# Patient Record
Sex: Female | Born: 1990 | Race: White | Hispanic: No | Marital: Single | Smoking: Current every day smoker
Health system: Southern US, Community
[De-identification: ages and names within clinical notes are randomized; demographics above are authoritative.]

## PROBLEM LIST (undated history)

## (undated) DIAGNOSIS — F1010 Alcohol abuse, uncomplicated: Secondary | ICD-10-CM

## (undated) DIAGNOSIS — F419 Anxiety disorder, unspecified: Secondary | ICD-10-CM

## (undated) DIAGNOSIS — F1190 Opioid use, unspecified, uncomplicated: Secondary | ICD-10-CM

## (undated) DIAGNOSIS — G8929 Other chronic pain: Secondary | ICD-10-CM

## (undated) DIAGNOSIS — D689 Coagulation defect, unspecified: Secondary | ICD-10-CM

## (undated) DIAGNOSIS — F1210 Cannabis abuse, uncomplicated: Secondary | ICD-10-CM

## (undated) DIAGNOSIS — F1410 Cocaine abuse, uncomplicated: Secondary | ICD-10-CM

## (undated) DIAGNOSIS — R519 Headache, unspecified: Secondary | ICD-10-CM

## (undated) DIAGNOSIS — F17200 Nicotine dependence, unspecified, uncomplicated: Secondary | ICD-10-CM

## (undated) DIAGNOSIS — F1910 Other psychoactive substance abuse, uncomplicated: Secondary | ICD-10-CM

## (undated) HISTORY — DX: Anxiety disorder, unspecified: F41.9

## (undated) HISTORY — PX: APPENDECTOMY: SHX54

## (undated) HISTORY — DX: Coagulation defect, unspecified: D68.9

---
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
## 2010-10-09 LAB — CBC
HCT: 31 % — ABNORMAL LOW (ref 36.0–46.0)
HCT: 32.2 % — ABNORMAL LOW (ref 36.0–46.0)
HCT: 33.5 % — ABNORMAL LOW (ref 36.0–46.0)
HCT: 34 % — ABNORMAL LOW (ref 36.0–46.0)
Hemoglobin: 11.3 g/dL — ABNORMAL LOW (ref 12.0–15.0)
Hemoglobin: 11.4 g/dL — ABNORMAL LOW (ref 12.0–15.0)
Hemoglobin: 11.8 g/dL — ABNORMAL LOW (ref 12.0–15.0)
Hemoglobin: 12.3 g/dL (ref 12.0–15.0)
MCHC: 34.4 g/dL (ref 30.0–36.0)
MCV: 90 fL (ref 78.0–100.0)
MCV: 91.5 fL (ref 78.0–100.0)
MCV: 91.6 fL (ref 78.0–100.0)
Platelets: 355 10*3/uL (ref 150–400)
Platelets: 382 10*3/uL (ref 150–400)
Platelets: 401 10*3/uL — ABNORMAL HIGH (ref 150–400)
Platelets: 426 10*3/uL — ABNORMAL HIGH (ref 150–400)
Platelets: 437 10*3/uL — ABNORMAL HIGH (ref 150–400)
RBC: 3.66 MIL/uL — ABNORMAL LOW (ref 3.87–5.11)
RBC: 3.71 MIL/uL — ABNORMAL LOW (ref 3.87–5.11)
RBC: 3.89 MIL/uL (ref 3.87–5.11)
RDW: 13.3 % (ref 11.5–15.5)
RDW: 13.4 % (ref 11.5–15.5)
RDW: 13.7 % (ref 11.5–15.5)
RDW: 14.2 % (ref 11.5–15.5)
RDW: 14.3 % (ref 11.5–15.5)
WBC: 16.1 10*3/uL — ABNORMAL HIGH (ref 4.0–10.5)
WBC: 18.7 10*3/uL — ABNORMAL HIGH (ref 4.0–10.5)
WBC: 22.1 10*3/uL — ABNORMAL HIGH (ref 4.0–10.5)
WBC: 25.3 10*3/uL — ABNORMAL HIGH (ref 4.0–10.5)

## 2010-10-09 LAB — URINALYSIS, ROUTINE W REFLEX MICROSCOPIC
Ketones, ur: 15 mg/dL — AB
Protein, ur: 30 mg/dL — AB
Urobilinogen, UA: 1 mg/dL (ref 0.0–1.0)

## 2010-10-09 LAB — BODY FLUID CULTURE: Culture: NO GROWTH

## 2010-10-09 LAB — DIFFERENTIAL
Basophils Absolute: 0 10*3/uL (ref 0.0–0.1)
Basophils Absolute: 0 10*3/uL (ref 0.0–0.1)
Basophils Absolute: 0 10*3/uL (ref 0.0–0.1)
Basophils Absolute: 0 10*3/uL (ref 0.0–0.1)
Basophils Absolute: 0.1 10*3/uL (ref 0.0–0.1)
Eosinophils Absolute: 0 10*3/uL (ref 0.0–0.7)
Eosinophils Absolute: 0.1 10*3/uL (ref 0.0–0.7)
Eosinophils Absolute: 0.2 10*3/uL (ref 0.0–0.7)
Eosinophils Absolute: 0.2 10*3/uL (ref 0.0–0.7)
Eosinophils Relative: 1 % (ref 0–5)
Eosinophils Relative: 1 % (ref 0–5)
Eosinophils Relative: 1 % (ref 0–5)
Eosinophils Relative: 1 % (ref 0–5)
Lymphocytes Relative: 3 % — ABNORMAL LOW (ref 12–46)
Lymphocytes Relative: 3 % — ABNORMAL LOW (ref 12–46)
Lymphocytes Relative: 4 % — ABNORMAL LOW (ref 12–46)
Lymphocytes Relative: 5 % — ABNORMAL LOW (ref 12–46)
Lymphocytes Relative: 6 % — ABNORMAL LOW (ref 12–46)
Lymphocytes Relative: 8 % — ABNORMAL LOW (ref 12–46)
Lymphocytes Relative: 9 % — ABNORMAL LOW (ref 12–46)
Lymphs Abs: 1.2 10*3/uL (ref 0.7–4.0)
Lymphs Abs: 1.4 10*3/uL (ref 0.7–4.0)
Lymphs Abs: 1.5 10*3/uL (ref 0.7–4.0)
Monocytes Absolute: 0.6 10*3/uL (ref 0.1–1.0)
Monocytes Absolute: 0.6 10*3/uL (ref 0.1–1.0)
Monocytes Absolute: 0.8 10*3/uL (ref 0.1–1.0)
Monocytes Absolute: 1.5 10*3/uL — ABNORMAL HIGH (ref 0.1–1.0)
Monocytes Absolute: 1.8 10*3/uL — ABNORMAL HIGH (ref 0.1–1.0)
Monocytes Relative: 7 % (ref 3–12)
Neutro Abs: 19.3 10*3/uL — ABNORMAL HIGH (ref 1.7–7.7)
Neutro Abs: 22.1 10*3/uL — ABNORMAL HIGH (ref 1.7–7.7)
Neutro Abs: 23.9 10*3/uL — ABNORMAL HIGH (ref 1.7–7.7)
Neutro Abs: 28.1 10*3/uL — ABNORMAL HIGH (ref 1.7–7.7)
Neutrophils Relative %: 82 % — ABNORMAL HIGH (ref 43–77)
Neutrophils Relative %: 92 % — ABNORMAL HIGH (ref 43–77)
Neutrophils Relative %: 95 % — ABNORMAL HIGH (ref 43–77)

## 2010-10-09 LAB — COMPREHENSIVE METABOLIC PANEL
Albumin: 1.9 g/dL — ABNORMAL LOW (ref 3.5–5.2)
BUN: 13 mg/dL (ref 6–23)
BUN: 8 mg/dL (ref 6–23)
CO2: 17 mEq/L — ABNORMAL LOW (ref 19–32)
Calcium: 7.9 mg/dL — ABNORMAL LOW (ref 8.4–10.5)
Creatinine, Ser: 0.61 mg/dL (ref 0.4–1.2)
Creatinine, Ser: 0.68 mg/dL (ref 0.4–1.2)
GFR calc non Af Amer: >60 mL/min (ref >60)
Glucose, Bld: 107 mg/dL — ABNORMAL HIGH (ref 70–99)
Potassium: 3.6 mEq/L (ref 3.5–5.1)
Sodium: 138 mEq/L (ref 135–145)
Total Bilirubin: 0.4 mg/dL (ref 0.3–1.2)
Total Protein: 5.1 g/dL — ABNORMAL LOW (ref 6.0–8.3)
Total Protein: 5.8 g/dL — ABNORMAL LOW (ref 6.0–8.3)

## 2010-10-09 LAB — HEPATITIS C ANTIBODY: HCV Ab: NEGATIVE

## 2010-10-09 LAB — RPR: RPR Ser Ql: NONREACTIVE

## 2010-10-09 LAB — GC/CHLAMYDIA PROBE AMP, GENITAL
Chlamydia, DNA Probe: NEGATIVE
GC Probe Amp, Genital: NEGATIVE

## 2010-10-09 LAB — CULTURE, BLOOD (ROUTINE X 2): Culture: NO GROWTH

## 2010-10-09 LAB — BASIC METABOLIC PANEL
CO2: 20 mEq/L (ref 19–32)
Calcium: 8.2 mg/dL — ABNORMAL LOW (ref 8.4–10.5)
Creatinine, Ser: 0.7 mg/dL (ref 0.4–1.2)
Glucose, Bld: 112 mg/dL — ABNORMAL HIGH (ref 70–99)

## 2010-10-09 LAB — URINE MICROSCOPIC-ADD ON

## 2010-10-09 LAB — ANAEROBIC CULTURE

## 2010-10-09 LAB — GRAM STAIN

## 2010-10-09 LAB — HIV ANTIBODY (ROUTINE TESTING W REFLEX): HIV: NONREACTIVE

## 2010-10-10 LAB — DIFFERENTIAL
Basophils Relative: 0 % (ref 0–1)
Eosinophils Absolute: 0 10*3/uL (ref 0.0–0.7)
Eosinophils Relative: 0 % (ref 0–5)
Neutrophils Relative %: 87 % — ABNORMAL HIGH (ref 43–77)

## 2010-10-10 LAB — URINALYSIS, ROUTINE W REFLEX MICROSCOPIC
Bilirubin Urine: NEGATIVE
Glucose, UA: 100 mg/dL — AB
Glucose, UA: NEGATIVE mg/dL
Nitrite: NEGATIVE
Protein, ur: 100 mg/dL — AB
Protein, ur: 100 mg/dL — AB
Specific Gravity, Urine: 1.025 (ref 1.005–1.030)
Urobilinogen, UA: 4 mg/dL — ABNORMAL HIGH (ref 0.0–1.0)

## 2010-10-10 LAB — URINE MICROSCOPIC-ADD ON

## 2010-10-10 LAB — HEPATIC FUNCTION PANEL
Albumin: 4 g/dL (ref 3.5–5.2)
Indirect Bilirubin: 0.4 mg/dL (ref 0.3–0.9)
Total Protein: 6.7 g/dL (ref 6.0–8.3)

## 2010-10-10 LAB — BASIC METABOLIC PANEL
BUN: 8 mg/dL (ref 6–23)
CO2: 26 mEq/L (ref 19–32)
Chloride: 103 mEq/L (ref 96–112)
Creatinine, Ser: 0.61 mg/dL (ref 0.4–1.2)
Glucose, Bld: 118 mg/dL — ABNORMAL HIGH (ref 70–99)

## 2010-10-10 LAB — CBC
HCT: 39.2 % (ref 36.0–46.0)
MCHC: 34.8 g/dL (ref 30.0–36.0)
MCV: 90.9 fL (ref 78.0–100.0)
Platelets: 183 10*3/uL (ref 150–400)
RDW: 13.2 % (ref 11.5–15.5)

## 2010-10-10 LAB — URINE CULTURE: Colony Count: NO GROWTH

## 2010-10-10 LAB — PREGNANCY, URINE: Preg Test, Ur: NEGATIVE
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
## 2010-11-16 NOTE — Discharge Summary (Signed)
[name], [name] NO.:  [id number]   MEDICAL RECORD NO.:  [id number]          PATIENT TYPE:  INP   LOCATION:  9319                          [facility]ACILITY:  [facility]   PHYSICIAN:  Allie Bossier, MD        DATE OF BIRTH:  [birth year]   DATE OF ADMISSION:  11/04/2008  DATE OF DISCHARGE:  11/11/2008                               DISCHARGE SUMMARY   [name] is an 20 year old single white, gravida 0, who was seen by the  nurse midwife, Carolanne Grumbling, on Nov 04, 2008.  She initially went to  the [facility] Emergency Department for abdominal pain.  She had a CT  scan which showed multiple pelvic abscesses in the right perihepatic  space upwards of 10 cm.  In the left upper quadrant, there was one  measuring 8 cm as well as some other areas of probable abscesses in the  pelvis.  Of note, her history is significant for a ruptured appendicitis  in 2002 for which she spent approximately 6 months at [facility]  when she was a child.  At that time, she had a partial colectomy done.  When she was seen by the midwife for Dr. Silverio Lay, she was admitted  directly to [facility] and started on IV antibiotics, specifically  doxycycline and Primaxin.  Cervical cultures were done.  They were  eventually negative.  The following day on Nov 06, 2008, her white count  was 23.9, this is down from 25.9.  She was still febrile, and she was  evaluated by Dr. Orvan Falconer from the Infectious Disease Department.  The  doxycycline was discontinued since her chlamydia cultures were negative.  Imipenem was continued.  CT scan was ordered for Nov 07, 2008, and it  showed no improvement, in fact some of the abscesses had continued to  grow.  She was continued on her IV antibiotics and her white count  continued to remain in the 20 range and she continued to have a low-  grade fever.  Her pain was plus/minus on the improvement scale.  By Nov 08, 2008, she underwent a CT-guided abscess drainage at  [facility] by  the Radiology Department, some fluid was withdrawn; however, another CT  scan done on Nov 10, 2008, showed only partial resolution of one of the  large abscesses or persistent large fluid collection consistent with  abscesses.  An ultrasound also done with Nov 10, 2008, that showed a  normal left ovary and a nonvisualized right ovary.  The uterus appeared  to be normal.  At this point, she was transferred to the Faculty  Service, and vancomycin was added on Nov 10, 2008, as well.  The patient  was beginning to asked to go to [facility] at this point, and by Nov 11, 2008, she was adamant that she wanted to be transferred to [facility] where  she had previously spent 6 months with a partial colectomy due to a  necrotic appendix.  I spoke with Dr. Shelle Iron who agreed to take  her and transfer, and she was sent there via ambulance.  Please note  that during her stay here, she was hemodynamically stable.  She ran a  low-grade temperature throughout.  Her T-max during her stay was 102.5  on Nov 08, 2008.  Her white count never went below 13 and was generally  in the 20 range.  Hemoglobin was always ranged anywhere from 11.8-9.9,  on discharge on Nov 11, 2008, it was 11.5.      Allie Bossier, MD  Electronically Signed     MCD/MEDQ  D:  12/09/2008  T:  12/10/2008  Job:  [id number]

## 2011-07-04 ENCOUNTER — Emergency Department (HOSPITAL_COMMUNITY)
Admission: EM | Admit: 2011-07-04 | Discharge: 2011-07-05 | Disposition: A | Discharge status: Home / Self Care | Payer: Medicaid Other | Attending: Emergency Medicine | Admitting: Emergency Medicine

## 2011-07-04 ENCOUNTER — Encounter: Payer: Self-pay | Admitting: *Deleted

## 2011-07-04 DIAGNOSIS — M542 Cervicalgia: Secondary | ICD-10-CM | POA: Insufficient documentation

## 2011-07-04 DIAGNOSIS — F17200 Nicotine dependence, unspecified, uncomplicated: Secondary | ICD-10-CM | POA: Insufficient documentation

## 2011-07-04 DIAGNOSIS — M549 Dorsalgia, unspecified: Secondary | ICD-10-CM | POA: Insufficient documentation

## 2011-07-04 DIAGNOSIS — R0602 Shortness of breath: Secondary | ICD-10-CM | POA: Insufficient documentation

## 2011-07-04 MED ORDER — OXYCODONE-ACETAMINOPHEN 5-325 MG PO TABS
1.0000 | ORAL_TABLET | Freq: Once | ORAL | Status: AC
  Administered 2011-07-05: 1 via ORAL
  Filled 2011-07-04: qty 1

## 2011-07-04 MED ORDER — IBUPROFEN 800 MG PO TABS
800.0000 mg | ORAL_TABLET | Freq: Once | ORAL | Status: AC
  Administered 2011-07-05: 800 mg via ORAL
  Filled 2011-07-04: qty 1

## 2011-07-04 NOTE — ED Notes (Signed)
Pain low back and up into neck and head

## 2011-07-04 NOTE — ED Provider Notes (Signed)
History     CSN: [id number]  Arrival date & time 07/04/11  2227   First MD Initiated Contact with Patient 07/04/11 2345      Chief Complaint  Patient presents with  . Back Pain    (Consider location/radiation/quality/duration/timing/severity/associated sxs/prior treatment) Patient is a 21 y.o. female presenting with back pain. The history is provided by the patient.  Back Pain  This is a new problem. Pertinent negatives include no chest pain and no abdominal pain.   patient states she is sitting on the couch playing a game when she developed pain in her midback radiating up toward back. It is constant. It's worse with movement. No trauma. No numbness or weakness. No cough or difficulty breathing. No chest pain. She does smoke. No nausea vomiting or diarrhea. She's not had pain like this before. She denies possibility of pregnancy.  History reviewed. No pertinent past medical history.  Past Surgical History  Procedure Date  . Appendectomy     Family History  Problem Relation Age of Onset  . Diabetes Mother     History  Substance Use Topics  . Smoking status: Current Everyday Smoker  . Smokeless tobacco: Not on file  . Alcohol Use: No    OB History    Grav Para Term Preterm Abortions TAB SAB Ect Mult Living                  Review of Systems  HENT: Negative for congestion.   Respiratory: Negative for cough and shortness of breath.   Cardiovascular: Negative for chest pain.  Gastrointestinal: Negative for abdominal pain.  Genitourinary: Negative for hematuria and flank pain.  Musculoskeletal: Positive for back pain.  Skin: Negative for pallor.  Psychiatric/Behavioral: Negative for confusion.    Allergies  Gentamicin and Penicillins  Home Medications   Current Outpatient Rx  Name Route Sig Dispense Refill  . IBUPROFEN 600 MG PO TABS Oral Take 1 tablet (600 mg total) by mouth every 6 (six) hours as needed for pain. 20 tablet 0  . OXYCODONE-ACETAMINOPHEN 5-325  MG PO TABS Oral Take 1-2 tablets by mouth every 6 (six) hours as needed for pain. 20 tablet 0    BP 112/69  Pulse 103  Temp(Src) 98.6 F (37 C) (Oral)  Resp 18  Ht 5\' 3"  (1.6 m)  Wt 115 lb (52.164 kg)  BMI 20.37 kg/m2  SpO2 98%  LMP 06/20/2011  Physical Exam  Nursing note and vitals reviewed. Constitutional: She is oriented to person, place, and time. She appears well-developed and well-nourished.  HENT:  Head: Normocephalic and atraumatic.  Eyes: EOM are normal. Pupils are equal, round, and reactive to light.  Neck: Normal range of motion. Neck supple.  Cardiovascular: Normal rate, regular rhythm and normal heart sounds.   No murmur heard. Pulmonary/Chest: Effort normal and breath sounds normal. No respiratory distress. She has no wheezes. She has no rales.  Abdominal: Soft. Bowel sounds are normal. She exhibits no distension. There is no tenderness. There is no rebound and no guarding.  Musculoskeletal: Normal range of motion.       Increase in pain with rotation of the thorax. No tenderness over thoracic spine. Some tenderness over cervical spine musculature.  Neurological: She is alert and oriented to person, place, and time. No cranial nerve deficit.  Skin: Skin is warm and dry.  Psychiatric: She has a normal mood and affect. Her speech is normal.    ED Course  Procedures (including critical care time)  Labs  Reviewed - No data to display Dg Chest 2 View  07/05/2011  *RADIOLOGY REPORT*  Clinical Data: Upper back pain and shortness of breath  CHEST - 2 VIEW  Comparison: None.  Findings: Normal heart size and pulmonary vascularity.  No focal airspace consolidation in the lungs.  No blunting of costophrenic angles.  No pneumothorax.  IMPRESSION: No evidence of active pulmonary disease.  Original Report Authenticated By: Marlon Pel, M.D.     1. Mid back pain       MDM  Mid back pain. Worse with movement. No clear trauma. X-ray done due to patient's smoking  history. He was negative. She'll be discharged home with pain meds.        Juliet Rude. Rubin Payor, MD 07/05/11 [phone]

## 2011-07-05 ENCOUNTER — Emergency Department (HOSPITAL_COMMUNITY): Payer: Medicaid Other

## 2011-07-05 MED ORDER — IBUPROFEN 600 MG PO TABS: 600.0000 mg | ORAL_TABLET | Freq: Four times a day (QID) | ORAL | Status: AC | PRN

## 2011-07-05 MED ORDER — OXYCODONE-ACETAMINOPHEN 5-325 MG PO TABS: 1.0000 | ORAL_TABLET | Freq: Four times a day (QID) | ORAL | Status: AC | PRN

## 2011-10-23 ENCOUNTER — Emergency Department (HOSPITAL_COMMUNITY)
Admission: EM | Admit: 2011-10-23 | Discharge: 2011-10-23 | Disposition: A | Discharge status: Home / Self Care | Payer: No Typology Code available for payment source | Attending: Emergency Medicine | Admitting: Emergency Medicine

## 2011-10-23 ENCOUNTER — Emergency Department (HOSPITAL_COMMUNITY): Payer: No Typology Code available for payment source

## 2011-10-23 ENCOUNTER — Encounter (HOSPITAL_COMMUNITY): Payer: Self-pay | Admitting: Emergency Medicine

## 2011-10-23 DIAGNOSIS — M25559 Pain in unspecified hip: Secondary | ICD-10-CM | POA: Insufficient documentation

## 2011-10-23 DIAGNOSIS — M25539 Pain in unspecified wrist: Secondary | ICD-10-CM | POA: Insufficient documentation

## 2011-10-23 DIAGNOSIS — M545 Low back pain, unspecified: Secondary | ICD-10-CM | POA: Insufficient documentation

## 2011-10-23 DIAGNOSIS — M79609 Pain in unspecified limb: Secondary | ICD-10-CM | POA: Insufficient documentation

## 2011-10-23 DIAGNOSIS — S61209A Unspecified open wound of unspecified finger without damage to nail, initial encounter: Secondary | ICD-10-CM | POA: Insufficient documentation

## 2011-10-23 DIAGNOSIS — M542 Cervicalgia: Secondary | ICD-10-CM | POA: Insufficient documentation

## 2011-10-23 DIAGNOSIS — IMO0001 Reserved for inherently not codable concepts without codable children: Secondary | ICD-10-CM | POA: Insufficient documentation

## 2011-10-23 LAB — POCT PREGNANCY, URINE: Preg Test, Ur: NEGATIVE

## 2011-10-23 MED ORDER — MORPHINE SULFATE 4 MG/ML IJ SOLN
4.0000 mg | Freq: Once | INTRAMUSCULAR | Status: AC
  Administered 2011-10-23: 4 mg via INTRAVENOUS
  Filled 2011-10-23: qty 1

## 2011-10-23 MED ORDER — IBUPROFEN 800 MG PO TABS: 800.0000 mg | ORAL_TABLET | Freq: Three times a day (TID) | ORAL | Status: AC | PRN

## 2011-10-23 MED ORDER — OXYCODONE-ACETAMINOPHEN 5-325 MG PO TABS: 1.0000 | ORAL_TABLET | Freq: Four times a day (QID) | ORAL | Status: AC | PRN

## 2011-10-23 MED ORDER — DIAZEPAM 5 MG PO TABS: 5.0000 mg | ORAL_TABLET | Freq: Three times a day (TID) | ORAL | Status: AC | PRN

## 2011-10-23 MED ORDER — IBUPROFEN 200 MG PO TABS
400.0000 mg | ORAL_TABLET | Freq: Once | ORAL | Status: AC
  Administered 2011-10-23: 400 mg via ORAL
  Filled 2011-10-23: qty 2

## 2011-10-23 MED ORDER — DIAZEPAM 5 MG PO TABS
5.0000 mg | ORAL_TABLET | Freq: Once | ORAL | Status: AC
  Administered 2011-10-23: 5 mg via ORAL
  Filled 2011-10-23: qty 1
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
## 2011-10-23 NOTE — ED Notes (Signed)
Wound care... Cleaned hands and put bacitracin and bandaid on right middle finger knuckle.

## 2011-10-23 NOTE — ED Notes (Signed)
Patient transported to CT 
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
## 2011-10-26 NOTE — ED Provider Notes (Signed)
Medical screening examination/treatment/procedure(s) were performed by non-physician practitioner and as supervising physician I was immediately available for consultation/collaboration.  Juliet Rude. Rubin Payor, MD 10/26/11 [phone]

## 2011-10-29 ENCOUNTER — Emergency Department (HOSPITAL_COMMUNITY)
Admission: EM | Admit: 2011-10-29 | Discharge: 2011-10-29 | Disposition: A | Discharge status: Home / Self Care | Payer: No Typology Code available for payment source | Attending: Emergency Medicine | Admitting: Emergency Medicine

## 2011-10-29 ENCOUNTER — Encounter (HOSPITAL_COMMUNITY): Payer: Self-pay

## 2011-10-29 DIAGNOSIS — F0781 Postconcussional syndrome: Secondary | ICD-10-CM | POA: Insufficient documentation

## 2011-10-29 DIAGNOSIS — R51 Headache: Secondary | ICD-10-CM

## 2011-10-29 DIAGNOSIS — F17200 Nicotine dependence, unspecified, uncomplicated: Secondary | ICD-10-CM | POA: Insufficient documentation

## 2011-10-29 MED ORDER — METOCLOPRAMIDE HCL 10 MG PO TABS: 10.0000 mg | ORAL_TABLET | Freq: Four times a day (QID) | ORAL | Status: DC | PRN

## 2011-10-29 MED ORDER — SODIUM CHLORIDE 0.9 % IV BOLUS (SEPSIS)
1000.0000 mL | Freq: Once | INTRAVENOUS | Status: AC
  Administered 2011-10-29: 1000 mL via INTRAVENOUS

## 2011-10-29 MED ORDER — METOCLOPRAMIDE HCL 5 MG/ML IJ SOLN
10.0000 mg | Freq: Once | INTRAMUSCULAR | Status: AC
  Administered 2011-10-29: 10 mg via INTRAVENOUS
  Filled 2011-10-29: qty 2

## 2011-10-29 MED ORDER — KETOROLAC TROMETHAMINE 30 MG/ML IJ SOLN
30.0000 mg | Freq: Once | INTRAMUSCULAR | Status: AC
  Administered 2011-10-29: 30 mg via INTRAVENOUS
  Filled 2011-10-29: qty 1

## 2011-10-29 MED ORDER — DEXAMETHASONE SODIUM PHOSPHATE 4 MG/ML IJ SOLN
10.0000 mg | Freq: Once | INTRAMUSCULAR | Status: AC
  Administered 2011-10-29: 10 mg via INTRAVENOUS
  Filled 2011-10-29: qty 3

## 2011-10-29 MED ORDER — DIPHENHYDRAMINE HCL 50 MG/ML IJ SOLN
25.0000 mg | Freq: Once | INTRAMUSCULAR | Status: AC
  Administered 2011-10-29: 25 mg via INTRAVENOUS
  Filled 2011-10-29: qty 1
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
## 2011-10-29 NOTE — Discharge Instructions (Signed)
Concussion and Brain Injury A blow or jolt to the head can disrupt the normal function of the brain. This type of brain injury is often called a "concussion" or a "closed head injury." Concussions are usually not life-threatening. Even so, the effects of a concussion can be serious.  CAUSES  A concussion is caused by a blunt blow to the head. The blow might be direct or indirect as described below.  Direct blow (running into another player during a soccer game, being hit in a fight, or hitting your head on a hard surface).   Indirect blow (when your head moves rapidly and violently back and forth like in a car crash).  SYMPTOMS  The brain is very complex. Every head injury is different. Some symptoms may appear right away. Other symptoms may not show up for days or weeks after the concussion. The signs of concussion can be hard to notice. Early on, problems may be missed by patients, family members, and caregivers. You may look fine even though you are acting or feeling differently.  These symptoms are usually temporary, but may last for days, weeks, or even longer. Symptoms include:  Mild headaches that will not go away.   Having more trouble than usual with:   Remembering things.   Paying attention or concentrating.   Organizing daily tasks.   Making decisions and solving problems.   Slowness in thinking, acting, speaking, or reading.   Getting lost or easily confused.   Feeling tired all the time or lacking energy (fatigue).   Feeling drowsy.   Sleep disturbances.   Sleeping more than usual.   Sleeping less than usual.   Trouble falling asleep.   Trouble sleeping (insomnia).   Loss of balance or feeling lightheaded or dizzy.   Nausea or vomiting.   Numbness or tingling.   Increased sensitivity to:   Sounds.   Lights.   Distractions.  Other symptoms might include:  Vision problems or eyes that tire easily.   Diminished sense of taste or smell.   Ringing  in the ears.   Mood changes such as feeling sad, anxious, or listless.   Becoming easily irritated or angry for little or no reason.   Lack of motivation.  DIAGNOSIS  Your caregiver can usually diagnose a concussion or mild brain injury based on your description of your injury and your symptoms.  Your evaluation might include:  A brain scan to look for signs of injury to the brain. Even if the test shows no injury, you may still have a concussion.   Blood tests to be sure other problems are not present.  TREATMENT   People with a concussion need to be examined and evaluated. Most people with concussions are treated in an emergency department, urgent care, or clinic. Some people must stay in the hospital overnight for further treatment.   Your caregiver will send you home with important instructions to follow. Be sure to carefully follow them.   Tell your caregiver if you are already taking any medicines (prescription, over-the-counter, or natural remedies), or if you are drinking alcohol or taking illegal drugs. Also, talk with your caregiver if you are taking blood thinners (anticoagulants) or aspirin. These drugs may increase your chances of complications. All of this is important information that may affect treatment.   Only take over-the-counter or prescription medicines for pain, discomfort, or fever as directed by your caregiver.  PROGNOSIS  How fast people recover from brain injury varies from person to person.   Although most people have a good recovery, how quickly they improve depends on many factors. These factors include how severe their concussion was, what part of the brain was injured, their age, and how healthy they were before the concussion.  Because all head injuries are different, so is recovery. Most people with mild injuries recover fully. Recovery can take time. In general, recovery is slower in older persons. Also, persons who have had a concussion in the past or have  other medical problems may find that it takes longer to recover from their current injury. Anxiety and depression may also make it harder to adjust to the symptoms of brain injury. HOME CARE INSTRUCTIONS  Return to your normal activities slowly, not all at once. You must give your body and brain enough time for recovery.  Get plenty of sleep at night, and rest during the day. Rest helps the brain to heal.   Avoid staying up late at night.   Keep the same bedtime hours on weekends and weekdays.   Take daytime naps or rest breaks when you feel tired.   Limit activities that require a lot of thought or concentration (brain or cognitive rest). This includes:   Homework or job-related work.   Watching TV.   Computer work.   Avoid activities that could lead to a second brain injury, such as contact or recreational sports, until your caregiver says it is okay. Even after your brain injury has healed, you should protect yourself from having another concussion.   Ask your caregiver when you can return to your normal activities such as driving, bicycling, or operating heavy equipment. Your ability to react may be slower after a brain injury.   Talk with your caregiver about when you can return to work or school.   Inform your teachers, school nurse, school counselor, coach, Product/process development scientist, or work Freight forwarder about your injury, symptoms, and restrictions. They should be instructed to report:   Increased problems with attention or concentration.   Increased problems remembering or learning new information.   Increased time needed to complete tasks or assignments.   Increased irritability or decreased ability to cope with stress.   Increased symptoms.   Take only those medicines that your caregiver has approved.   Do not drink alcohol until your caregiver says you are well enough to do so. Alcohol and certain other drugs may slow your recovery and can put you at risk of further injury.    If it is harder than usual to remember things, write them down.   If you are easily distracted, try to do one thing at a time. For example, do not try to watch TV while fixing dinner.   Talk with family members or close friends when making important decisions.   Keep all follow-up appointments. Repeated evaluation of your symptoms is recommended for your recovery.  PREVENTION  Protect your head from future injury. It is very important to avoid another head or brain injury before you have recovered. In rare cases, another injury has lead to permanent brain damage, brain swelling, or death. Avoid injuries by using:  Seatbelts when riding in a car.   Alcohol only in moderation.   A helmet when biking, skiing, skateboarding, skating, or doing similar activities.   Safety measures in your home.   Remove clutter and tripping hazards from floors and stairways.   Use grab bars in bathrooms and handrails by stairs.   Place non-slip mats on floors and in bathtubs.  Improve lighting in dim areas.  SEEK MEDICAL CARE IF:  A head injury can cause lingering symptoms. You should seek medical care if you have any of the following symptoms for more than 3 weeks after your injury or are planning to return to sports:  Chronic headaches.   Dizziness or balance problems.   Nausea.   Vision problems.   Increased sensitivity to noise or light.   Depression or mood swings.   Anxiety or irritability.   Memory problems.   Difficulty concentrating or paying attention.   Sleep problems.   Feeling tired all the time.  SEEK IMMEDIATE MEDICAL CARE IF:  You have had a blow or jolt to the head and you (or your family or friends) notice:  Severe or worsening headaches.   Weakness (even if only in one hand or one leg or one part of the face), numbness, or decreased coordination.   Repeated vomiting.   Increased sleepiness or passing out.   One black center of the eye (pupil) is larger  than the other.   Convulsions (seizures).   Slurred speech.   Increasing confusion, restlessness, agitation, or irritability.   Lack of ability to recognize people or places.   Neck pain.   Difficulty being awakened.   Unusual behavior changes.   Loss of consciousness.  Older adults with a brain injury may have a higher risk of serious complications such as a blood clot on the brain. Headaches that get worse or an increase in confusion are signs of this complication. If these signs occur, see a caregiver right away. MAKE SURE YOU:   Understand these instructions.   Will watch your condition.   Will get help right away if you are not doing well or get worse.  FOR MORE INFORMATION  Several groups help people with brain injury and their families. They provide information and put people in touch with local resources. These include support groups, rehabilitation services, and a variety of health care professionals. Among these groups, the Brain Injury Association (BIA, www.biausa.org) has a Secretary/administrator that gathers scientific and educational information and works on a national level to help people with brain injury.  Document Released: 09/07/2003 Document Revised: 06/06/2011 Document Reviewed: 02/03/2008 [facility] Patient Information 2012 [place], [place].  Headache Headaches are caused by many different problems. Most commonly, headache is caused by muscle tension from an injury, fatigue, or emotional upset. Excessive muscle contractions in the scalp and neck result in a headache that often feels like a tight band around the head. Tension headaches often have areas of tenderness over the scalp and the back of the neck. These headaches may last for hours, days, or longer, and some may contribute to migraines in those who have migraine problems. Migraines usually cause a throbbing headache, which is made worse by activity. Sometimes only one side of the head hurts. Nausea, vomiting, eye  pain, and avoidance of food are common with migraines. Visual symptoms such as light sensitivity, blind spots, or flashing lights may also occur. Loud noises may worsen migraine headaches. Many factors may cause migraine headaches:  Emotional stress, lack of sleep, and menstrual periods.   Alcohol and some drugs (such as birth control pills).   Diet factors (fasting, caffeine, food preservatives, chocolate).   Environmental factors (weather changes, bright lights, odors, smoke).  Other causes of headaches include minor injuries to the head. Arthritis in the neck; problems with the jaw, eyes, ears, or nose are also causes of headaches. Allergies, drugs, alcohol,  and exposure to smoke can also cause moderate headaches. Rebound headaches can occur if someone uses pain medications for a long period of time and then stops. Less commonly, blood vessel problems in the neck and brain (including stroke) can cause various types of headache. Treatment of headaches includes medicines for pain and relaxation. Ice packs or heat applied to the back of the head and neck help some people. Massaging the shoulders, neck and scalp are often very useful. Relaxation techniques and stretching can help prevent these headaches. Avoid alcohol and cigarette smoking as these tend to make headaches worse. Please see your caregiver if your headache is not better in 2 days.  SEEK IMMEDIATE MEDICAL CARE IF:   You develop a high fever, chills, or repeated vomiting.   You faint or have difficulty with vision.   You develop unusual numbness or weakness of your arms or legs.   Relief of pain is inadequate with medication, or you develop severe pain.   You develop confusion, or neck stiffness.   You have a worsening of a headache or do not obtain relief.  Document Released: 06/17/2005 Document Revised: 06/06/2011 Document Reviewed: 12/11/2006 [facility] Patient Information 2012 [place], [place].  Metoclopramide tablets What is  this medicine? METOCLOPRAMIDE (met oh kloe PRA mide) is used to treat the symptoms of gastroesophageal reflux disease (GERD) like heartburn. It is also used to treat people with slow emptying of the stomach and intestinal tract. This medicine may be used for other purposes; ask your health care provider or pharmacist if you have questions. What should I tell my health care provider before I take this medicine? They need to know if you have any of these conditions: -breast cancer -depression -diabetes -heart failure -high blood pressure -kidney disease -liver disease -Parkinson's disease or a movement disorder -pheochromocytoma -seizures -stomach obstruction, bleeding, or perforation -an unusual or allergic reaction to metoclopramide, procainamide, sulfites, other medicines, foods, dyes, or preservatives -pregnant or trying to get pregnant -breast-feeding How should I use this medicine? Take this medicine by mouth with a glass of water. Follow the directions on the prescription label. Take this medicine on an empty stomach, about 30 minutes before eating. Take your doses at regular intervals. Do not take your medicine more often than directed. Do not stop taking except on the advice of your doctor or health care professional. A special MedGuide will be given to you by the pharmacist with each prescription and refill. Be sure to read this information carefully each time. Talk to your pediatrician regarding the use of this medicine in children. Special care may be needed. Overdosage: If you think you have taken too much of this medicine contact a poison control center or emergency room at once. NOTE: This medicine is only for you. Do not share this medicine with others. What if I miss a dose? If you miss a dose, take it as soon as you can. If it is almost time for your next dose, take only that dose. Do not take double or extra doses. What may interact with this  medicine? -acetaminophen -cyclosporine -digoxin -medicines for blood pressure -medicines for diabetes, including insulin -medicines for hay fever and other allergies -medicines for depression, especially an Monoamine Oxidase Inhibitor (MAOI) -medicines for Parkinson's disease, like levodopa -medicines for sleep or for pain -tetracycline This list may not describe all possible interactions. Give your health care provider a list of all the medicines, herbs, non-prescription drugs, or dietary supplements you use. Also tell them if you  smoke, drink alcohol, or use illegal drugs. Some items may interact with your medicine. What should I watch for while using this medicine? It may take a few weeks for your stomach condition to start to get better. However, do not take this medicine for longer than 12 weeks. The longer you take this medicine, and the more you take it, the greater your chances are of developing serious side effects. If you are an elderly patient, a female patient, or you have diabetes, you may be at an increased risk for side effects from this medicine. Contact your doctor immediately if you start having movements you cannot control such as lip smacking, rapid movements of the tongue, involuntary or uncontrollable movements of the eyes, head, arms and legs, or muscle twitches and spasms. Patients and their families should watch out for worsening depression or thoughts of suicide. Also watch out for any sudden or severe changes in feelings such as feeling anxious, agitated, panicky, irritable, hostile, aggressive, impulsive, severely restless, overly excited and hyperactive, or not being able to sleep. If this happens, especially at the beginning of treatment or after a change in dose, call your doctor. Do not treat yourself for high fever. Ask your doctor or health care professional for advice. You may get drowsy or dizzy. Do not drive, use machinery, or do anything that needs mental  alertness until you know how this drug affects you. Do not stand or sit up quickly, especially if you are an older patient. This reduces the risk of dizzy or fainting spells. Alcohol can make you more drowsy and dizzy. Avoid alcoholic drinks. What side effects may I notice from receiving this medicine? Side effects that you should report to your doctor or health care professional as soon as possible: -allergic reactions like skin rash, itching or hives, swelling of the face, lips, or tongue -abnormal production of milk in females -breast enlargement in both males and females -change in the way you walk -difficulty moving, speaking or swallowing -drooling, lip smacking, or rapid movements of the tongue -excessive sweating -fever -involuntary or uncontrollable movements of the eyes, head, arms and legs -irregular heartbeat or palpitations -muscle twitches and spasms -unusually weak or tired Side effects that usually do not require medical attention (report to your doctor or health care professional if they continue or are bothersome): -change in sex drive or performance -depressed mood -diarrhea -difficulty sleeping -headache -menstrual changes -restless or nervous This list may not describe all possible side effects. Call your doctor for medical advice about side effects. You may report side effects to FDA at 1-800-FDA-1088. Where should I keep my medicine? Keep out of the reach of children. Store at room temperature between 20 and 25 degrees C (68 and 77 degrees F). Protect from light. Keep container tightly closed. Throw away any unused medicine after the expiration date. NOTE: This sheet is a summary. It may not cover all possible information. If you have questions about this medicine, talk to your doctor, pharmacist, or health care provider.  2012, [employer]. (02/10/2008 4:30:05 PM)
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
## 2011-11-04 ENCOUNTER — Emergency Department (HOSPITAL_COMMUNITY)
Admission: EM | Admit: 2011-11-04 | Discharge: 2011-11-04 | Disposition: A | Discharge status: Home / Self Care | Payer: No Typology Code available for payment source | Attending: Emergency Medicine | Admitting: Emergency Medicine

## 2011-11-04 ENCOUNTER — Encounter (HOSPITAL_COMMUNITY): Payer: Self-pay | Admitting: Emergency Medicine

## 2011-11-04 DIAGNOSIS — M542 Cervicalgia: Secondary | ICD-10-CM | POA: Insufficient documentation

## 2011-11-04 DIAGNOSIS — R42 Dizziness and giddiness: Secondary | ICD-10-CM | POA: Insufficient documentation

## 2011-11-04 DIAGNOSIS — R51 Headache: Secondary | ICD-10-CM | POA: Insufficient documentation

## 2011-11-04 MED ORDER — DIAZEPAM 5 MG PO TABS: 5.0000 mg | ORAL_TABLET | Freq: Three times a day (TID) | ORAL | Status: AC | PRN

## 2011-11-04 NOTE — ED Provider Notes (Signed)
Medical screening examination/treatment/procedure(s) were performed by non-physician practitioner and as supervising physician I was immediately available for consultation/collaboration.   Issabelle Mcraney, MD 11/04/11 1617 
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
## 2011-11-20 ENCOUNTER — Ambulatory Visit: Payer: No Typology Code available for payment source | Attending: Specialist | Admitting: Physical Therapy

## 2011-11-20 DIAGNOSIS — R293 Abnormal posture: Secondary | ICD-10-CM | POA: Insufficient documentation

## 2011-11-20 DIAGNOSIS — R5381 Other malaise: Secondary | ICD-10-CM | POA: Insufficient documentation

## 2011-11-20 DIAGNOSIS — IMO0001 Reserved for inherently not codable concepts without codable children: Secondary | ICD-10-CM | POA: Insufficient documentation

## 2011-11-20 DIAGNOSIS — M546 Pain in thoracic spine: Secondary | ICD-10-CM | POA: Insufficient documentation

## 2011-11-20 DIAGNOSIS — M542 Cervicalgia: Secondary | ICD-10-CM | POA: Insufficient documentation

## 2011-11-22 ENCOUNTER — Ambulatory Visit: Payer: No Typology Code available for payment source | Admitting: Physical Therapy

## 2011-11-26 ENCOUNTER — Ambulatory Visit: Payer: No Typology Code available for payment source | Admitting: Physical Therapy

## 2011-11-28 ENCOUNTER — Ambulatory Visit: Payer: No Typology Code available for payment source | Admitting: Physical Therapy

## 2011-12-02 ENCOUNTER — Ambulatory Visit: Payer: No Typology Code available for payment source | Attending: Specialist | Admitting: Physical Therapy

## 2011-12-02 DIAGNOSIS — R293 Abnormal posture: Secondary | ICD-10-CM | POA: Insufficient documentation

## 2011-12-02 DIAGNOSIS — M546 Pain in thoracic spine: Secondary | ICD-10-CM | POA: Insufficient documentation

## 2011-12-02 DIAGNOSIS — M542 Cervicalgia: Secondary | ICD-10-CM | POA: Insufficient documentation

## 2011-12-02 DIAGNOSIS — R5381 Other malaise: Secondary | ICD-10-CM | POA: Insufficient documentation

## 2011-12-02 DIAGNOSIS — IMO0001 Reserved for inherently not codable concepts without codable children: Secondary | ICD-10-CM | POA: Insufficient documentation

## 2011-12-04 ENCOUNTER — Ambulatory Visit: Payer: No Typology Code available for payment source | Admitting: Physical Therapy

## 2011-12-10 ENCOUNTER — Ambulatory Visit: Payer: No Typology Code available for payment source | Admitting: Physical Therapy

## 2011-12-12 ENCOUNTER — Ambulatory Visit: Payer: No Typology Code available for payment source | Admitting: Physical Therapy

## 2011-12-17 ENCOUNTER — Ambulatory Visit: Payer: No Typology Code available for payment source | Admitting: *Deleted

## 2011-12-19 ENCOUNTER — Ambulatory Visit: Payer: No Typology Code available for payment source | Admitting: Physical Therapy

## 2011-12-24 ENCOUNTER — Ambulatory Visit: Payer: No Typology Code available for payment source | Admitting: *Deleted

## 2011-12-26 ENCOUNTER — Ambulatory Visit: Payer: No Typology Code available for payment source | Admitting: *Deleted

## 2012-06-04 ENCOUNTER — Emergency Department (HOSPITAL_COMMUNITY)
Admission: EM | Admit: 2012-06-04 | Discharge: 2012-06-04 | Disposition: A | Discharge status: Home / Self Care | Payer: Self-pay | Attending: Emergency Medicine | Admitting: Emergency Medicine

## 2012-06-04 ENCOUNTER — Encounter (HOSPITAL_COMMUNITY): Payer: Self-pay | Admitting: Emergency Medicine

## 2012-06-04 DIAGNOSIS — Z8669 Personal history of other diseases of the nervous system and sense organs: Secondary | ICD-10-CM | POA: Insufficient documentation

## 2012-06-04 DIAGNOSIS — J029 Acute pharyngitis, unspecified: Secondary | ICD-10-CM | POA: Insufficient documentation

## 2012-06-04 DIAGNOSIS — F17200 Nicotine dependence, unspecified, uncomplicated: Secondary | ICD-10-CM | POA: Insufficient documentation

## 2012-06-04 DIAGNOSIS — H9209 Otalgia, unspecified ear: Secondary | ICD-10-CM | POA: Insufficient documentation

## 2012-06-04 DIAGNOSIS — Z79899 Other long term (current) drug therapy: Secondary | ICD-10-CM | POA: Insufficient documentation

## 2012-06-04 MED ORDER — ERYTHROMYCIN BASE 500 MG PO TABS: 500.0000 mg | ORAL_TABLET | Freq: Two times a day (BID) | ORAL | Status: DC

## 2012-06-04 MED ORDER — ERYTHROMYCIN BASE 250 MG PO TABS
500.0000 mg | ORAL_TABLET | Freq: Once | ORAL | Status: AC
  Administered 2012-06-04: 500 mg via ORAL
  Filled 2012-06-04: qty 2

## 2012-06-04 NOTE — ED Notes (Signed)
Pt c/o sorethroat x 4 days ago that is getting worse. Denies fever. otc meds with no relief. No sweling noted.

## 2012-06-04 NOTE — ED Notes (Signed)
PA at bedside.

## 2012-06-04 NOTE — ED Provider Notes (Signed)
Medical screening examination/treatment/procedure(s) were performed by non-physician practitioner and as supervising physician I was immediately available for consultation/collaboration.   Lyanne Co, MD 06/04/12 [phone]

## 2012-06-04 NOTE — ED Notes (Signed)
Patient with no complaints at this time. Respirations even and unlabored. Skin warm/dry. Discharge instructions reviewed with patient at this time. Patient given opportunity to voice concerns/ask questions. Patient discharged at this time and left Emergency Department with steady gait.   

## 2012-06-04 NOTE — ED Provider Notes (Signed)
History     CSN: [id number]  Arrival date & time 06/04/12  4098   First MD Initiated Contact with Patient 06/04/12 [phone]      Chief Complaint  Patient presents with  . Sore Throat    (Consider location/radiation/quality/duration/timing/severity/associated sxs/prior treatment) HPI Comments: Denies fever.  + L ear pain   Patient is a 21 y.o. female presenting with pharyngitis. The history is provided by the patient. No language interpreter was used.  Sore Throat This is a new problem. Episode onset: 4 days ago. The problem occurs constantly. The problem has been gradually worsening. Associated symptoms include a sore throat. Pertinent negatives include no chills or fever. The symptoms are aggravated by swallowing. Treatments tried: dayquil. The treatment provided mild relief.    Past Medical History  Diagnosis Date  . Concussion     Past Surgical History  Procedure Date  . Appendectomy     Family History  Problem Relation Age of Onset  . Diabetes Mother   . Hypertension Mother     History  Substance Use Topics  . Smoking status: Current Every Day Smoker    Types: Cigarettes  . Smokeless tobacco: Not on file  . Alcohol Use: Yes     Comment: occasional drinker    OB History    Grav Para Term Preterm Abortions TAB SAB Ect Mult Living                  Review of Systems  Constitutional: Negative for fever and chills.  HENT: Positive for ear pain and sore throat. Negative for hearing loss and ear discharge.     Allergies  Gentamicin and Penicillins  Home Medications   Current Outpatient Rx  Name  Route  Sig  Dispense  Refill  . IBUPROFEN 200 MG PO TABS   Oral   Take 600 mg by mouth every 6 (six) hours as needed. For pain.         Marland Kitchen DAYQUIL PO   Oral   Take 2 capsules by mouth every 6 (six) hours as needed. Cold/flu symptoms         . ERYTHROMYCIN BASE 500 MG PO TABS   Oral   Take 1 tablet (500 mg total) by mouth 2 (two) times daily.   20  tablet   0   . METOCLOPRAMIDE HCL 10 MG PO TABS   Oral   Take 1 tablet (10 mg total) by mouth every 6 (six) hours as needed (nausea or headache).   30 tablet   0     BP 116/72  Pulse 86  Temp 98.2 F (36.8 C) (Oral)  Resp 18  SpO2 99%  LMP 05/28/2012  Physical Exam  Nursing note and vitals reviewed. Constitutional: She is oriented to person, place, and time. She appears well-developed and well-nourished. No distress.  HENT:  Head: Normocephalic and atraumatic.  Right Ear: Hearing, tympanic membrane, external ear and ear canal normal.  Left Ear: Hearing, tympanic membrane, external ear and ear canal normal.  Mouth/Throat: Uvula is midline and mucous membranes are normal. No uvula swelling. Oropharyngeal exudate and posterior oropharyngeal erythema present. No posterior oropharyngeal edema or tonsillar abscesses.  Eyes: EOM are normal.  Neck: Normal range of motion.  Cardiovascular: Normal rate and regular rhythm.   Pulmonary/Chest: Effort normal. No respiratory distress.  Abdominal: Soft. She exhibits no distension. There is no tenderness.  Musculoskeletal: Normal range of motion.  Lymphadenopathy:    She has cervical adenopathy.  Right cervical: Superficial cervical and deep cervical adenopathy present.       Left cervical: Superficial cervical and deep cervical adenopathy present.  Neurological: She is alert and oriented to person, place, and time.  Skin: Skin is warm and dry.  Psychiatric: She has a normal mood and affect. Judgment normal.    ED Course  Procedures (including critical care time)  Labs Reviewed - No data to display No results found.   1. Pharyngitis       MDM  Ery 500 mg BID, 20 Tylenol or ibuprofen Salt water  Gargles F/u with PCP prn        Evalina Field, PA 06/04/12 [phone]

## 2014-12-20 ENCOUNTER — Emergency Department (HOSPITAL_COMMUNITY): Payer: Medicaid Other

## 2014-12-20 ENCOUNTER — Encounter (HOSPITAL_COMMUNITY): Payer: Self-pay | Admitting: *Deleted

## 2014-12-20 ENCOUNTER — Emergency Department (HOSPITAL_COMMUNITY)
Admission: EM | Admit: 2014-12-20 | Discharge: 2014-12-21 | Disposition: A | Discharge status: Home / Self Care | Payer: Medicaid Other | Attending: Emergency Medicine | Admitting: Emergency Medicine

## 2014-12-20 DIAGNOSIS — Z3202 Encounter for pregnancy test, result negative: Secondary | ICD-10-CM | POA: Insufficient documentation

## 2014-12-20 DIAGNOSIS — S20319A Abrasion of unspecified front wall of thorax, initial encounter: Secondary | ICD-10-CM | POA: Insufficient documentation

## 2014-12-20 DIAGNOSIS — T07XXXA Unspecified multiple injuries, initial encounter: Secondary | ICD-10-CM

## 2014-12-20 DIAGNOSIS — S1081XA Abrasion of other specified part of neck, initial encounter: Secondary | ICD-10-CM | POA: Insufficient documentation

## 2014-12-20 DIAGNOSIS — S9030XA Contusion of unspecified foot, initial encounter: Secondary | ICD-10-CM | POA: Insufficient documentation

## 2014-12-20 DIAGNOSIS — S60212A Contusion of left wrist, initial encounter: Secondary | ICD-10-CM | POA: Insufficient documentation

## 2014-12-20 DIAGNOSIS — S0012XA Contusion of left eyelid and periocular area, initial encounter: Secondary | ICD-10-CM | POA: Insufficient documentation

## 2014-12-20 DIAGNOSIS — Y9289 Other specified places as the place of occurrence of the external cause: Secondary | ICD-10-CM | POA: Insufficient documentation

## 2014-12-20 DIAGNOSIS — S0011XA Contusion of right eyelid and periocular area, initial encounter: Secondary | ICD-10-CM | POA: Insufficient documentation

## 2014-12-20 DIAGNOSIS — Y9389 Activity, other specified: Secondary | ICD-10-CM | POA: Insufficient documentation

## 2014-12-20 DIAGNOSIS — S022XXA Fracture of nasal bones, initial encounter for closed fracture: Secondary | ICD-10-CM | POA: Insufficient documentation

## 2014-12-20 DIAGNOSIS — Y998 Other external cause status: Secondary | ICD-10-CM | POA: Insufficient documentation

## 2014-12-20 DIAGNOSIS — S20212A Contusion of left front wall of thorax, initial encounter: Secondary | ICD-10-CM | POA: Insufficient documentation

## 2014-12-20 MED ORDER — OXYCODONE-ACETAMINOPHEN 5-325 MG PO TABS
2.0000 | ORAL_TABLET | Freq: Once | ORAL | Status: AC
  Administered 2014-12-20: 2 via ORAL
  Filled 2014-12-20: qty 2
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
## 2014-12-20 NOTE — ED Notes (Signed)
Patient ambulatory to restroom to collect urine sample 

## 2014-12-21 LAB — PREGNANCY, URINE: Preg Test, Ur: NEGATIVE

## 2014-12-21 MED ORDER — OXYCODONE-ACETAMINOPHEN 5-325 MG PO TABS: 1.0000 | ORAL_TABLET | Freq: Four times a day (QID) | ORAL | Status: DC | PRN

## 2014-12-21 MED ORDER — IBUPROFEN 600 MG PO TABS
600.0000 mg | ORAL_TABLET | Freq: Four times a day (QID) | ORAL | Status: DC | PRN
Start: 2014-12-21 — End: 2015-06-11

## 2014-12-21 NOTE — Discharge Instructions (Signed)
Assault, General Assault includes any behavior, whether intentional or reckless, which results in bodily injury to another person and/or damage to property. Included in this would be any behavior, intentional or reckless, that by its nature would be understood (interpreted) by a reasonable person as intent to harm another person or to damage his/her property. Threats may be oral or written. They may be communicated through regular mail, computer, fax, or phone. These threats may be direct or implied. FORMS OF ASSAULT INCLUDE:  Physically assaulting a person. This includes physical threats to inflict physical harm as well as:  Slapping.  Hitting.  Poking.  Kicking.  Punching.  Pushing.  Arson.  Sabotage.  Equipment vandalism.  Damaging or destroying property.  Throwing or hitting objects.  Displaying a weapon or an object that appears to be a weapon in a threatening manner.  Carrying a firearm of any kind.  Using a weapon to harm someone.  Using greater physical size/strength to intimidate another.  Making intimidating or threatening gestures.  Bullying.  Hazing.  Intimidating, threatening, hostile, or abusive language directed toward another person.  It communicates the intention to engage in violence against that person. And it leads a reasonable person to expect that violent behavior may occur.  Stalking another person. IF IT HAPPENS AGAIN:  Immediately call for emergency help (911 in [place]  If someone poses clear and immediate danger to you, seek legal authorities to have a protective or restraining order put in place.  Less threatening assaults can at least be reported to authorities. STEPS TO TAKE IF A SEXUAL ASSAULT HAS HAPPENED  Go to an area of safety. This may include a shelter or staying with a friend. Stay away from the area where you have been attacked. A large percentage of sexual assaults are caused by a friend, relative or associate.  If  medications were given by your caregiver, take them as directed for the full length of time prescribed.  Only take over-the-counter or prescription medicines for pain, discomfort, or fever as directed by your caregiver.  If you have come in contact with a sexual disease, find out if you are to be tested again. If your caregiver is concerned about the HIV/AIDS virus, he/she may require you to have continued testing for several months.  For the protection of your privacy, test results can not be given over the phone. Make sure you receive the results of your test. If your test results are not back during your visit, make an appointment with your caregiver to find out the results. Do not assume everything is normal if you have not heard from your caregiver or the medical facility. It is important for you to follow up on all of your test results.  File appropriate papers with authorities. This is important in all assaults, even if it has occurred in a family or by a friend. SEEK MEDICAL CARE IF:  You have new problems because of your injuries.  You have problems that may be because of the medicine you are taking, such as:  Rash.  Itching.  Swelling.  Trouble breathing.  You develop belly (abdominal) pain, feel sick to your stomach (nausea) or are vomiting.  You begin to run a temperature.  You need supportive care or referral to a rape crisis center. These are centers with trained personnel who can help you get through this ordeal. SEEK IMMEDIATE MEDICAL CARE IF:  You are afraid of being threatened, beaten, or abused. In [place] call 911.  You  receive new injuries related to abuse.  You develop severe pain in any area injured in the assault or have any change in your condition that concerns you.  You faint or lose consciousness.  You develop chest pain or shortness of breath. Document Released: 06/17/2005 Document Revised: 09/09/2011 Document Reviewed: 02/03/2008 [facility] Patient  Information 2015 [place], [place]. This information is not intended to replace advice given to you by your health care provider. Make sure you discuss any questions you have with your health care provider. Nasal Fracture A nasal fracture is a break or crack in the bones of the nose. A minor break usually heals in a month. You often will receive black eyes from a nasal fracture. This is not a cause for concern. The black eyes will go away over 1 to 2 weeks.  DIAGNOSIS  Your caregiver may want to examine you if you are concerned about a fracture of the nose. X-rays of the nose may not show a nasal fracture even when one is present. Sometimes your caregiver must wait 1 to 5 days after the injury to re-check the nose for alignment and to take additional X-rays. Sometimes the caregiver must wait until the swelling has gone down. TREATMENT Minor fractures that have caused no deformity often do not require treatment. More serious fractures where bones are displaced may require surgery. This will take place after the swelling is gone. Surgery will stabilize and align the fracture. HOME CARE INSTRUCTIONS   Put ice on the injured area.  Put ice in a plastic bag.  Place a towel between your skin and the bag.  Leave the ice on for 15-20 minutes, 03-04 times a day.  Take medications as directed by your caregiver.  Only take over-the-counter or prescription medicines for pain, discomfort, or fever as directed by your caregiver.  If your nose starts bleeding, squeeze the soft parts of the nose against the center wall while you are sitting in an upright position for 10 minutes.  Contact sports should be avoided for at least 3 to 4 weeks or as directed by your caregiver. SEEK MEDICAL CARE IF:  Your pain increases or becomes severe.  You continue to have nosebleeds.  The shape of your nose does not return to normal within 5 days.  You have pus draining from the nose. SEEK IMMEDIATE MEDICAL CARE IF:    You have bleeding from your nose that does not stop after 20 minutes of pinching the nostrils closed and keeping ice on the nose.  You have clear fluid draining from your nose.  You notice a grape-like swelling on the dividing wall between the nostrils (septum). This is a collection of blood (hematoma) that must be drained to help prevent infection.  You have difficulty moving your eyes.  You have recurrent vomiting. Document Released: 06/14/2000 Document Revised: 09/09/2011 Document Reviewed: 10/01/2010 [facility] Patient Information 2015 [place], [place]. This information is not intended to replace advice given to you by your health care provider. Make sure you discuss any questions you have with your health care provider. Chest Contusion A chest contusion is a deep bruise on your chest area. Contusions are the result of an injury that caused bleeding under the skin. A chest contusion may involve bruising of the skin, muscles, or ribs. The contusion may turn blue, purple, or yellow. Minor injuries will give you a painless contusion, but more severe contusions may stay painful and swollen for a few weeks. CAUSES  A contusion is usually caused by  a blow, trauma, or direct force to an area of the body. SYMPTOMS   Swelling and redness of the injured area.  Discoloration of the injured area.  Tenderness and soreness of the injured area.  Pain. DIAGNOSIS  The diagnosis can be made by taking a history and performing a physical exam. An X-ray, CT scan, or MRI may be needed to determine if there were any associated injuries, such as broken bones (fractures) or internal injuries. TREATMENT  Often, the best treatment for a chest contusion is resting, icing, and applying cold compresses to the injured area. Deep breathing exercises may be recommended to reduce the risk of pneumonia. Over-the-counter medicines may also be recommended for pain control. HOME CARE INSTRUCTIONS   Put ice on the  injured area.  Put ice in a plastic bag.  Place a towel between your skin and the bag.  Leave the ice on for 15-20 minutes, 03-04 times a day.  Only take over-the-counter or prescription medicines as directed by your caregiver. Your caregiver may recommend avoiding anti-inflammatory medicines (aspirin, ibuprofen, and naproxen) for 48 hours because these medicines may increase bruising.  Rest the injured area.  Perform deep-breathing exercises as directed by your caregiver.  Stop smoking if you smoke.  Do not lift objects over 5 pounds (2.3 kg) for 3 days or longer if recommended by your caregiver. SEEK IMMEDIATE MEDICAL CARE IF:   You have increased bruising or swelling.  You have pain that is getting worse.  You have difficulty breathing.  You have dizziness, weakness, or fainting.  You have blood in your urine or stool.  You cough up or vomit blood.  Your swelling or pain is not relieved with medicines. MAKE SURE YOU:   Understand these instructions.  Will watch your condition.  Will get help right away if you are not doing well or get worse. Document Released: 03/12/2001 Document Revised: 03/11/2012 Document Reviewed: 12/09/2011 [facility] Patient Information 2015 [place], [place]. This information is not intended to replace advice given to you by your health care provider. Make sure you discuss any questions you have with your health care provider.

## 2014-12-21 NOTE — ED Notes (Signed)
Patient verbalizes understanding of discharge instructions, prescription medications, home care and follow up care. Patient ambulatory out of department at this time with family. 

## 2015-05-29 ENCOUNTER — Ambulatory Visit (INDEPENDENT_AMBULATORY_CARE_PROVIDER_SITE_OTHER): Payer: PRIVATE HEALTH INSURANCE | Admitting: Family Medicine

## 2015-05-29 ENCOUNTER — Encounter: Payer: Self-pay | Admitting: Family Medicine

## 2015-05-29 VITALS — BP 116/78 | HR 104 | Temp 98.3°F | Ht 63.0 in | Wt 111.8 lb

## 2015-05-29 DIAGNOSIS — J209 Acute bronchitis, unspecified: Secondary | ICD-10-CM | POA: Diagnosis not present

## 2015-05-29 MED ORDER — AZITHROMYCIN 250 MG PO TABS: ORAL_TABLET | ORAL | Status: DC

## 2015-05-29 NOTE — Progress Notes (Signed)
   HPI  Patient presents today today with acute illness.  Patient explains she's had cough, congestion, headache, and right ear pain for the past 4 days.  She also complains of subjective fever, malaise She does not seem to be getting better and today has developed worsening shortness of breath  She has missed work today. She denies chest pain, sore throat  PMH: Smoking status noted ROS: Per HPI  Objective: BP 116/78 mmHg  Pulse 104  Temp(Src) 98.3 F (36.8 C) (Oral)  Ht 5\' 3"  (1.6 m)  Wt 111 lb 12.8 oz (50.712 kg)  BMI 19.81 kg/m2 Gen: NAD, alert, cooperative with exam HEENT: NCAT, TMs normal bilaterally, nares clear, oropharynx clear Neck: A few small swollen nontender lymph nodes CV: RRR, good S1/S2, no murmur Resp: CTABL, no wheezes, non-labored Ext: No edema, warm Neuro: Alert and oriented, No gross deficits  Assessment and plan:  # Acute bronchitis Considering severity and worsening of illness I will go ahead and treat her for acute bronchitis with azithromycin Discussed smoking Fluids, supportive care Note written for 1 day out of work   Meds ordered this encounter  Medications  . azithromycin (ZITHROMAX) 250 MG tablet    Sig: Take 2 tablets on day 1 and 1 tablet daily after that    Dispense:  6 tablet    Refill:  0    Murtis SinkSam Bradshaw, MD [facility] 05/29/2015, 4:34 PM

## 2015-05-29 NOTE — Patient Instructions (Signed)
Great to see you!  Finish all of the antibiotics Eat yogurt once daily while on the medicine  Acute Bronchitis Bronchitis is inflammation of the airways that extend from the windpipe into the lungs (bronchi). The inflammation often causes mucus to develop. This leads to a cough, which is the most common symptom of bronchitis.  In acute bronchitis, the condition usually develops suddenly and goes away over time, usually in a couple weeks. Smoking, allergies, and asthma can make bronchitis worse. Repeated episodes of bronchitis may cause further lung problems.  CAUSES Acute bronchitis is most often caused by the same virus that causes a cold. The virus can spread from person to person (contagious) through coughing, sneezing, and touching contaminated objects. SIGNS AND SYMPTOMS   Cough.   Fever.   Coughing up mucus.   Body aches.   Chest congestion.   Chills.   Shortness of breath.   Sore throat.  DIAGNOSIS  Acute bronchitis is usually diagnosed through a physical exam. Your health care provider will also ask you questions about your medical history. Tests, such as chest X-rays, are sometimes done to rule out other conditions.  TREATMENT  Acute bronchitis usually goes away in a couple weeks. Oftentimes, no medical treatment is necessary. Medicines are sometimes given for relief of fever or cough. Antibiotic medicines are usually not needed but may be prescribed in certain situations. In some cases, an inhaler may be recommended to help reduce shortness of breath and control the cough. A cool mist vaporizer may also be used to help thin bronchial secretions and make it easier to clear the chest.  HOME CARE INSTRUCTIONS  Get plenty of rest.   Drink enough fluids to keep your urine clear or pale yellow (unless you have a medical condition that requires fluid restriction). Increasing fluids may help thin your respiratory secretions (sputum) and reduce chest congestion, and it  will prevent dehydration.   Take medicines only as directed by your health care provider.  If you were prescribed an antibiotic medicine, finish it all even if you start to feel better.  Avoid smoking and secondhand smoke. Exposure to cigarette smoke or irritating chemicals will make bronchitis worse. If you are a smoker, consider using nicotine gum or skin patches to help control withdrawal symptoms. Quitting smoking will help your lungs heal faster.   Reduce the chances of another bout of acute bronchitis by washing your hands frequently, avoiding people with cold symptoms, and trying not to touch your hands to your mouth, nose, or eyes.   Keep all follow-up visits as directed by your health care provider.  SEEK MEDICAL CARE IF: Your symptoms do not improve after 1 week of treatment.  SEEK IMMEDIATE MEDICAL CARE IF:  You develop an increased fever or chills.   You have chest pain.   You have severe shortness of breath.  You have bloody sputum.   You develop dehydration.  You faint or repeatedly feel like you are going to pass out.  You develop repeated vomiting.  You develop a severe headache. MAKE SURE YOU:   Understand these instructions.  Will watch your condition.  Will get help right away if you are not doing well or get worse.   This information is not intended to replace advice given to you by your health care provider. Make sure you discuss any questions you have with your health care provider.   Document Released: 07/25/2004 Document Revised: 07/08/2014 Document Reviewed: 12/08/2012 Elsevier Interactive Patient Education [employer] Elsevier Inc.

## 2015-06-11 ENCOUNTER — Emergency Department (HOSPITAL_COMMUNITY)
Admission: EM | Admit: 2015-06-11 | Discharge: 2015-06-11 | Disposition: A | Discharge status: Home / Self Care | Payer: Medicaid Other | Attending: Emergency Medicine | Admitting: Emergency Medicine

## 2015-06-11 ENCOUNTER — Encounter (HOSPITAL_COMMUNITY): Payer: Self-pay | Admitting: *Deleted

## 2015-06-11 DIAGNOSIS — Z880 Allergy status to penicillin: Secondary | ICD-10-CM | POA: Insufficient documentation

## 2015-06-11 DIAGNOSIS — F17210 Nicotine dependence, cigarettes, uncomplicated: Secondary | ICD-10-CM | POA: Insufficient documentation

## 2015-06-11 DIAGNOSIS — R112 Nausea with vomiting, unspecified: Secondary | ICD-10-CM | POA: Insufficient documentation

## 2015-06-11 DIAGNOSIS — R197 Diarrhea, unspecified: Secondary | ICD-10-CM | POA: Insufficient documentation

## 2015-06-11 DIAGNOSIS — Z87828 Personal history of other (healed) physical injury and trauma: Secondary | ICD-10-CM | POA: Insufficient documentation

## 2015-06-11 LAB — COMPREHENSIVE METABOLIC PANEL
ALK PHOS: 73 U/L (ref 38–126)
ALT: 16 U/L (ref 14–54)
ANION GAP: 9 (ref 5–15)
AST: 22 U/L (ref 15–41)
Albumin: 4.8 g/dL (ref 3.5–5.0)
BILIRUBIN TOTAL: 0.7 mg/dL (ref 0.3–1.2)
BUN: 11 mg/dL (ref 6–20)
CALCIUM: 9.5 mg/dL (ref 8.9–10.3)
CO2: 23 mmol/L (ref 22–32)
Chloride: 107 mmol/L (ref 101–111)
Creatinine, Ser: 0.54 mg/dL (ref 0.44–1.00)
GFR calc Af Amer: >60 mL/min (ref >60)
Glucose, Bld: 123 mg/dL — ABNORMAL HIGH (ref 65–99)
Potassium: 3.6 mmol/L (ref 3.5–5.1)
SODIUM: 139 mmol/L (ref 135–145)
TOTAL PROTEIN: 8.1 g/dL (ref 6.5–8.1)

## 2015-06-11 LAB — CBC WITH DIFFERENTIAL/PLATELET
Basophils Absolute: 0 10*3/uL (ref 0.0–0.1)
Basophils Relative: 0 %
EOS ABS: 0 10*3/uL (ref 0.0–0.7)
EOS PCT: 0 %
HCT: 44.1 % (ref 36.0–46.0)
HEMOGLOBIN: 15.2 g/dL — AB (ref 12.0–15.0)
LYMPHS ABS: 1.2 10*3/uL (ref 0.7–4.0)
Lymphocytes Relative: 7 %
MCH: 31.4 pg (ref 26.0–34.0)
MCHC: 34.5 g/dL (ref 30.0–36.0)
MCV: 91.1 fL (ref 78.0–100.0)
Monocytes Absolute: 0.6 10*3/uL (ref 0.1–1.0)
Monocytes Relative: 3 %
NEUTROS PCT: 90 %
Neutro Abs: 14.7 10*3/uL — ABNORMAL HIGH (ref 1.7–7.7)
Platelets: 302 10*3/uL (ref 150–400)
RBC: 4.84 MIL/uL (ref 3.87–5.11)
RDW: 13.2 % (ref 11.5–15.5)
WBC: 16.4 10*3/uL — ABNORMAL HIGH (ref 4.0–10.5)

## 2015-06-11 LAB — I-STAT CHEM 8, ED
BUN: 10 mg/dL (ref 6–20)
CALCIUM ION: 1.16 mmol/L (ref 1.12–1.23)
CHLORIDE: 104 mmol/L (ref 101–111)
Creatinine, Ser: 0.5 mg/dL (ref 0.44–1.00)
GLUCOSE: 116 mg/dL — AB (ref 65–99)
HCT: 49 % — ABNORMAL HIGH (ref 36.0–46.0)
Hemoglobin: 16.7 g/dL — ABNORMAL HIGH (ref 12.0–15.0)
Potassium: 3.6 mmol/L (ref 3.5–5.1)
Sodium: 141 mmol/L (ref 135–145)
TCO2: 21 mmol/L (ref 0–100)

## 2015-06-11 LAB — I-STAT BETA HCG BLOOD, ED (MC, WL, AP ONLY)

## 2015-06-11 LAB — LIPASE, BLOOD: Lipase: 27 U/L (ref 11–51)

## 2015-06-11 MED ORDER — ONDANSETRON HCL 4 MG/2ML IJ SOLN
4.0000 mg | Freq: Once | INTRAMUSCULAR | Status: AC
  Administered 2015-06-11: 4 mg via INTRAMUSCULAR
  Filled 2015-06-11: qty 2

## 2015-06-11 MED ORDER — SODIUM CHLORIDE 0.9 % IV BOLUS (SEPSIS)
1000.0000 mL | Freq: Once | INTRAVENOUS | Status: AC
  Administered 2015-06-11: 1000 mL via INTRAVENOUS

## 2015-06-11 MED ORDER — ONDANSETRON 4 MG PO TBDP
4.0000 mg | ORAL_TABLET | Freq: Once | ORAL | Status: AC | PRN
  Administered 2015-06-11: 4 mg via ORAL
  Filled 2015-06-11: qty 1

## 2015-06-11 MED ORDER — ONDANSETRON HCL 4 MG PO TABS: 4.0000 mg | ORAL_TABLET | Freq: Three times a day (TID) | ORAL | Status: DC | PRN

## 2015-06-11 MED ORDER — IBUPROFEN 800 MG PO TABS
800.0000 mg | ORAL_TABLET | Freq: Once | ORAL | Status: AC
  Administered 2015-06-11: 800 mg via ORAL
  Filled 2015-06-11: qty 1

## 2015-06-11 NOTE — ED Provider Notes (Signed)
CSN: [id number]     Arrival date & time 06/11/15  1808 History   First MD Initiated Contact with Patient 06/11/15 2040     Chief Complaint  Patient presents with  . Emesis     (Consider location/radiation/quality/duration/timing/severity/associated sxs/prior Treatment) HPI 24 year old female with history of appendectomy who presents with nausea, vomiting, and diarrhea. States that she went to bed in her usual state of health, and woke up with intractable nausea, vomiting. Had 2 episodes of diarrhea this morning as well. Denies hematemesis or coffee ground emesis. Has not had any bloody stools or melena. Notes intermittent abdominal cramping associated with vomiting and diarrhea. Denies any abdominal distention, fevers or chills, dysuria or urinary frequency. States that she started her period this morning, but her periods do not normally present like this. Denies any abnormal vaginal discharge. Unknown sick contacts, and denies any possible food borne etiologies. No recent traveling or antibiotics. Past Medical History  Diagnosis Date  . Concussion    Past Surgical History  Procedure Laterality Date  . Appendectomy     Family History  Problem Relation Age of Onset  . Diabetes Mother   . Hypertension Mother    Social History  Substance Use Topics  . Smoking status: Current Every Day Smoker    Types: Cigarettes  . Smokeless tobacco: None  . Alcohol Use: No   OB History    No data available     Review of Systems 10/14 systems reviewed and are negative other than those stated in the HPI   Allergies  Gentamicin and Penicillins  Home Medications   Prior to Admission medications   Medication Sig Start Date End Date Taking? Authorizing Provider  ondansetron (ZOFRAN) 4 MG tablet Take 1 tablet (4 mg total) by mouth every 8 (eight) hours as needed for nausea or vomiting. 06/11/15   Lavera Guiseana Duo Lutie Pickler, MD   BP 95/57 mmHg  Pulse 92  Temp(Src) 98.3 F (36.8 C) (Oral)  Resp 14  Ht  5\' 3"  (1.6 m)  Wt 115 lb (52.164 kg)  BMI 20.38 kg/m2  SpO2 100%  LMP 06/11/2015 Physical Exam Physical Exam  Nursing note and vitals reviewed. Constitutional: Well developed, well nourished, non-toxic, and in no acute distress Head: Normocephalic and atraumatic.  Mouth/Throat: Oropharynx is clear and moist.  Neck: Normal range of motion. Neck supple.  Cardiovascular: Normal rate and regular rhythm.   Pulmonary/Chest: Effort normal and breath sounds normal.  Abdominal: Soft. There is no tenderness. There is no rebound and no guarding.  Musculoskeletal: Normal range of motion.  Neurological: Alert, no facial droop, fluent speech, moves all extremities symmetrically Skin: Skin is warm and dry.  Psychiatric: Cooperative  ED Course  Procedures (including critical care time) Labs Review Labs Reviewed  CBC WITH DIFFERENTIAL/PLATELET - Abnormal; Notable for the following:    WBC 16.4 (*)    Hemoglobin 15.2 (*)    Neutro Abs 14.7 (*)    All other components within normal limits  COMPREHENSIVE METABOLIC PANEL - Abnormal; Notable for the following:    Glucose, Bld 123 (*)    All other components within normal limits  I-STAT CHEM 8, ED - Abnormal; Notable for the following:    Glucose, Bld 116 (*)    Hemoglobin 16.7 (*)    HCT 49.0 (*)    All other components within normal limits  LIPASE, BLOOD  I-STAT BETA HCG BLOOD, ED ([facility], [facility], [facility] ONLY)    Imaging Review No results found. I have personally reviewed  and evaluated these images and lab results as part of my medical decision-making.   EKG Interpretation None      MDM   Final diagnoses:  Nausea vomiting and diarrhea    24 year old female who presents with nausea, vomiting, and diarrhea. Vital signs are non-concerning and she is nontoxic and in no acute distress. Feels persistently nauseous after oral Zofran ODT. Appears well-hydrated on exam. States that she has still been able to urinate today. Abdomen is soft and benign.  Low suspicion for serious or toxi intra-abdominal processes. Presentation seems consistent with likely benign GI illness. Is given IV fluids and anti-emetics, and feels symptomatically improved. Has been able to tolerate by mouth intake. Will be discharged home with supportive care instructions.    Lavera Guise, MD 06/12/15 [id number]

## 2015-06-11 NOTE — ED Notes (Signed)
Pt c/o n/v/d that started today 

## 2015-06-11 NOTE — Discharge Instructions (Signed)
Return for worsening symptoms, including worsening pain, vomiting and unable to keep down food/fluids, fevers, or any other symptoms concerning to you.  Diarrhea Diarrhea is frequent loose and watery bowel movements. It can cause you to feel weak and dehydrated. Dehydration can cause you to become tired and thirsty, have a dry mouth, and have decreased urination that often is dark yellow. Diarrhea is a sign of another problem, most often an infection that will not last long. In most cases, diarrhea typically lasts 2-3 days. However, it can last longer if it is a sign of something more serious. It is important to treat your diarrhea as directed by your caregiver to lessen or prevent future episodes of diarrhea. CAUSES  Some common causes include:  Gastrointestinal infections caused by viruses, bacteria, or parasites.  Food poisoning or food allergies.  Certain medicines, such as antibiotics, chemotherapy, and laxatives.  Artificial sweeteners and fructose.  Digestive disorders. HOME CARE INSTRUCTIONS  Ensure adequate fluid intake (hydration): Have 1 cup (8 oz) of fluid for each diarrhea episode. Avoid fluids that contain simple sugars or sports drinks, fruit juices, whole milk products, and sodas. Your urine should be clear or pale yellow if you are drinking enough fluids. Hydrate with an oral rehydration solution that you can purchase at pharmacies, retail stores, and online. You can prepare an oral rehydration solution at home by mixing the following ingredients together:   - tsp table salt.   tsp baking soda.   tsp salt substitute containing potassium chloride.  1  tablespoons sugar.  1 L (34 oz) of water.  Certain foods and beverages may increase the speed at which food moves through the gastrointestinal (GI) tract. These foods and beverages should be avoided and include:  Caffeinated and alcoholic beverages.  High-fiber foods, such as raw fruits and vegetables, nuts, seeds, and  whole grain breads and cereals.  Foods and beverages sweetened with sugar alcohols, such as xylitol, sorbitol, and mannitol.  Some foods may be well tolerated and may help thicken stool including:  Starchy foods, such as rice, toast, pasta, low-sugar cereal, oatmeal, grits, baked potatoes, crackers, and bagels.  Bananas.  Applesauce.  Add probiotic-rich foods to help increase healthy bacteria in the GI tract, such as yogurt and fermented milk products.  Wash your hands well after each diarrhea episode.  Only take over-the-counter or prescription medicines as directed by your caregiver.  Take a warm bath to relieve any burning or pain from frequent diarrhea episodes. SEEK IMMEDIATE MEDICAL CARE IF:   You are unable to keep fluids down.  You have persistent vomiting.  You have blood in your stool, or your stools are black and tarry.  You do not urinate in 6-8 hours, or there is only a small amount of very dark urine.  You have abdominal pain that increases or localizes.  You have weakness, dizziness, confusion, or light-headedness.  You have a severe headache.  Your diarrhea gets worse or does not get better.  You have a fever or persistent symptoms for more than 2-3 days.  You have a fever and your symptoms suddenly get worse. MAKE SURE YOU:   Understand these instructions.  Will watch your condition.  Will get help right away if you are not doing well or get worse.   This information is not intended to replace advice given to you by your health care provider. Make sure you discuss any questions you have with your health care provider.   Document Released: 06/07/2002 Document Revised:  07/08/2014 Document Reviewed: 02/23/2012 Elsevier Interactive Patient Education 2016 Elsevier Inc.  Nausea and Vomiting Nausea is a sick feeling that often comes before throwing up (vomiting). Vomiting is a reflex where stomach contents come out of your mouth. Vomiting can cause  severe loss of body fluids (dehydration). Children and elderly adults can become dehydrated quickly, especially if they also have diarrhea. Nausea and vomiting are symptoms of a condition or disease. It is important to find the cause of your symptoms. CAUSES   Direct irritation of the stomach lining. This irritation can result from increased acid production (gastroesophageal reflux disease), infection, food poisoning, taking certain medicines (such as nonsteroidal anti-inflammatory drugs), alcohol use, or tobacco use.  Signals from the brain.These signals could be caused by a headache, heat exposure, an inner ear disturbance, increased pressure in the brain from injury, infection, a tumor, or a concussion, pain, emotional stimulus, or metabolic problems.  An obstruction in the gastrointestinal tract (bowel obstruction).  Illnesses such as diabetes, hepatitis, gallbladder problems, appendicitis, kidney problems, cancer, sepsis, atypical symptoms of a heart attack, or eating disorders.  Medical treatments such as chemotherapy and radiation.  Receiving medicine that makes you sleep (general anesthetic) during surgery. DIAGNOSIS Your caregiver may ask for tests to be done if the problems do not improve after a few days. Tests may also be done if symptoms are severe or if the reason for the nausea and vomiting is not clear. Tests may include:  Urine tests.  Blood tests.  Stool tests.  Cultures (to look for evidence of infection).  X-rays or other imaging studies. Test results can help your caregiver make decisions about treatment or the need for additional tests. TREATMENT You need to stay well hydrated. Drink frequently but in small amounts.You may wish to drink water, sports drinks, clear broth, or eat frozen ice pops or gelatin dessert to help stay hydrated.When you eat, eating slowly may help prevent nausea.There are also some antinausea medicines that may help prevent nausea. HOME  CARE INSTRUCTIONS   Take all medicine as directed by your caregiver.  If you do not have an appetite, do not force yourself to eat. However, you must continue to drink fluids.  If you have an appetite, eat a normal diet unless your caregiver tells you differently.  Eat a variety of complex carbohydrates (rice, wheat, potatoes, bread), lean meats, yogurt, fruits, and vegetables.  Avoid high-fat foods because they are more difficult to digest.  Drink enough water and fluids to keep your urine clear or pale yellow.  If you are dehydrated, ask your caregiver for specific rehydration instructions. Signs of dehydration may include:  Severe thirst.  Dry lips and mouth.  Dizziness.  Dark urine.  Decreasing urine frequency and amount.  Confusion.  Rapid breathing or pulse. SEEK IMMEDIATE MEDICAL CARE IF:   You have blood or brown flecks (like coffee grounds) in your vomit.  You have black or bloody stools.  You have a severe headache or stiff neck.  You are confused.  You have severe abdominal pain.  You have chest pain or trouble breathing.  You do not urinate at least once every 8 hours.  You develop cold or clammy skin.  You continue to vomit for longer than 24 to 48 hours.  You have a fever. MAKE SURE YOU:   Understand these instructions.  Will watch your condition.  Will get help right away if you are not doing well or get worse.   This information is not intended  to replace advice given to you by your health care provider. Make sure you discuss any questions you have with your health care provider.   Document Released: 06/17/2005 Document Revised: 09/09/2011 Document Reviewed: 11/14/2010 Elsevier Interactive Patient Education [employer] Inc.

## 2016-04-19 ENCOUNTER — Ambulatory Visit: Payer: PRIVATE HEALTH INSURANCE

## 2016-04-19 ENCOUNTER — Ambulatory Visit: Payer: PRIVATE HEALTH INSURANCE | Admitting: Pediatrics

## 2016-04-19 ENCOUNTER — Ambulatory Visit (INDEPENDENT_AMBULATORY_CARE_PROVIDER_SITE_OTHER): Payer: 59 | Admitting: Pediatrics

## 2016-04-19 ENCOUNTER — Encounter: Payer: Self-pay | Admitting: Pediatrics

## 2016-04-19 VITALS — BP 110/71 | HR 97 | Temp 98.6°F | Ht 63.0 in | Wt 110.6 lb

## 2016-04-19 DIAGNOSIS — R8281 Pyuria: Secondary | ICD-10-CM

## 2016-04-19 DIAGNOSIS — N390 Urinary tract infection, site not specified: Secondary | ICD-10-CM

## 2016-04-19 DIAGNOSIS — N3001 Acute cystitis with hematuria: Secondary | ICD-10-CM

## 2016-04-19 DIAGNOSIS — M545 Low back pain, unspecified: Secondary | ICD-10-CM

## 2016-04-19 LAB — URINALYSIS, COMPLETE
BILIRUBIN UA: NEGATIVE
GLUCOSE, UA: NEGATIVE
Ketones, UA: NEGATIVE
Nitrite, UA: NEGATIVE
PH UA: 6.5 (ref 5.0–7.5)
SPEC GRAV UA: 1.015 (ref 1.005–1.030)
UUROB: 1 mg/dL (ref 0.2–1.0)

## 2016-04-19 LAB — MICROSCOPIC EXAMINATION: WBC, UA: >30 /hpf — AB (ref 0–?)

## 2016-04-19 MED ORDER — NITROFURANTOIN MONOHYD MACRO 100 MG PO CAPS: 100.0000 mg | ORAL_CAPSULE | Freq: Two times a day (BID) | ORAL | 0 refills | Status: AC

## 2016-04-19 NOTE — Patient Instructions (Signed)
Drink lots of fluids

## 2016-04-19 NOTE — Progress Notes (Signed)
  Subjective:   Patient ID: [name], female    DOB: 05-26-1991, 25 y.o.   MRN: [id number] CC: Back Pain and Urinary Frequency  HPI: [name] is a 25 y.o. female presenting for Back Pain and Urinary Frequency  Past week has had back pain No fevers Drinking lots of cranberry juice Poor appetite Urinary urgency and frequency No bladder spasms Some suprapubic pain   Relevant past medical, surgical, family and social history reviewed. Allergies and medications reviewed and updated. History  Smoking Status  . Current Every Day Smoker  . Types: Cigarettes  Smokeless Tobacco  . Never Used   ROS: Per HPI   Objective:    BP 110/71   Pulse 97   Temp 98.6 F (37 C) (Oral)   Ht 5\' 3"  (1.6 m)   Wt 110 lb 9.6 oz (50.2 kg)   BMI 19.59 kg/m   Wt Readings from Last 3 Encounters:  04/19/16 110 lb 9.6 oz (50.2 kg)  06/11/15 115 lb (52.2 kg)  05/29/15 111 lb 12.8 oz (50.7 kg)    Gen: NAD, alert, cooperative with exam, NCAT EYES: EOMI, no conjunctival injection, or no icterus CV: NRRR, normal S1/S2, no murmur, distal pulses 2+ b/l Resp: CTABL, no wheezes, normal WOB Abd: +BS, soft, mildly tender R side with palpation, ND. no guarding or organomegaly, no CVA tenderness Ext: No edema, warm Neuro: Alert and oriented MSK: normal muscle bulk  Assessment & Plan:  [name] was seen today for back pain and urinary frequency.  Diagnoses and all orders for this visit:  Pyuria -     nitrofurantoin, macrocrystal-monohydrate, (MACROBID) 100 MG capsule; Take 1 capsule (100 mg total) by mouth 2 (two) times daily. -     Urine culture  Acute low back pain without sciatica, unspecified back pain laterality -     Urinalysis, Complete  Acute cystitis with hematuria  Other orders -     Microscopic Examination   Follow up plan: Return if symptoms worsen or fail to improve. Rex Krasarol Randee Upchurch, MD [facility]

## 2016-04-22 LAB — URINE CULTURE

## 2018-05-29 ENCOUNTER — Encounter (HOSPITAL_COMMUNITY): Payer: Self-pay | Admitting: Emergency Medicine

## 2018-05-29 ENCOUNTER — Emergency Department (HOSPITAL_COMMUNITY): Payer: No Typology Code available for payment source

## 2018-05-29 ENCOUNTER — Emergency Department (HOSPITAL_COMMUNITY)
Admission: EM | Admit: 2018-05-29 | Discharge: 2018-05-29 | Disposition: A | Discharge status: Home / Self Care | Payer: No Typology Code available for payment source | Attending: Emergency Medicine | Admitting: Emergency Medicine

## 2018-05-29 DIAGNOSIS — Y998 Other external cause status: Secondary | ICD-10-CM | POA: Insufficient documentation

## 2018-05-29 DIAGNOSIS — F17210 Nicotine dependence, cigarettes, uncomplicated: Secondary | ICD-10-CM | POA: Insufficient documentation

## 2018-05-29 DIAGNOSIS — Y929 Unspecified place or not applicable: Secondary | ICD-10-CM | POA: Diagnosis not present

## 2018-05-29 DIAGNOSIS — S52515A Nondisplaced fracture of left radial styloid process, initial encounter for closed fracture: Secondary | ICD-10-CM | POA: Insufficient documentation

## 2018-05-29 DIAGNOSIS — Y9389 Activity, other specified: Secondary | ICD-10-CM | POA: Diagnosis not present

## 2018-05-29 DIAGNOSIS — S6992XA Unspecified injury of left wrist, hand and finger(s), initial encounter: Secondary | ICD-10-CM | POA: Diagnosis present

## 2018-05-29 NOTE — Progress Notes (Signed)
Orthopedic Tech Progress Note Patient Details:  Rachel ConnersBrittany L [name] 1990/12/18 [id number]  Ortho Devices Type of Ortho Device: Arm sling, Sugartong splint Ortho Device/Splint Location: lle Ortho Device/Splint Interventions: Ordered, Application, Adjustment   Post Interventions Patient Tolerated: Well Instructions Provided: Care of device, Adjustment of device   Trinna PostMartinez, Briunna Leicht J 05/29/2018, 7:04 PM

## 2018-05-29 NOTE — ED Notes (Signed)
ED Provider at bedside. 

## 2018-05-29 NOTE — ED Notes (Signed)
Patient verbalizes understanding of discharge instructions. Opportunity for questioning and answers were provided. Armband removed by staff, pt discharged from ED. Follow up care/referal reviewed. Pt ambulatory to lobby with friend.
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
## 2018-05-29 NOTE — Discharge Instructions (Signed)
Please read and follow all provided instructions.  Your diagnoses today include:  1. Closed nondisplaced fracture of styloid process of left radius, initial encounter     Tests performed today include:  An x-ray of the affected area - shows radial styloid fracture  Vital signs. See below for your results today.   Medications prescribed:   None  Take any prescribed medications only as directed.  Home care instructions:   Follow any educational materials contained in this packet  Follow R.I.C.E. Protocol:  R - rest your injury   I  - use ice on injury without applying directly to skin  C - compress injury with bandage or splint  E - elevate the injury as much as possible  Follow-up instructions: Please follow-up with the hand surgeon listed next week for further evaluation.  Return instructions:   Please return if your fingers are numb or tingling, appear gray or blue, or you have severe pain (also elevate the arm and loosen splint or wrap if you were given one)  Please return to the Emergency Department if you experience worsening symptoms.   Please return if you have any other emergent concerns.  Additional Information:  Your vital signs today were: BP 109/76 (BP Location: Right Arm)    Pulse 93    Temp 98.2 F (36.8 C) (Oral)    Resp 16    Ht 5\' 3"  (1.6 m)    Wt 52.2 kg    LMP 05/11/2018 (Exact Date)    SpO2 100%    BMI 20.37 kg/m  If your blood pressure (BP) was elevated above 135/85 this visit, please have this repeated by your doctor within one month. --------------
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
## 2018-05-29 NOTE — ED Notes (Signed)
Ortho tech paged  

## 2018-05-29 NOTE — ED Notes (Signed)
Patient transported to X-ray 

## 2018-06-02 ENCOUNTER — Emergency Department (HOSPITAL_COMMUNITY)
Admission: EM | Admit: 2018-06-02 | Discharge: 2018-06-02 | Disposition: A | Discharge status: Home / Self Care | Payer: Self-pay | Attending: Emergency Medicine | Admitting: Emergency Medicine

## 2018-06-02 ENCOUNTER — Encounter (HOSPITAL_COMMUNITY): Payer: Self-pay

## 2018-06-02 ENCOUNTER — Other Ambulatory Visit: Payer: Self-pay

## 2018-06-02 DIAGNOSIS — T40601A Poisoning by unspecified narcotics, accidental (unintentional), initial encounter: Secondary | ICD-10-CM | POA: Insufficient documentation

## 2018-06-02 DIAGNOSIS — T50901A Poisoning by unspecified drugs, medicaments and biological substances, accidental (unintentional), initial encounter: Secondary | ICD-10-CM

## 2018-06-02 DIAGNOSIS — F17210 Nicotine dependence, cigarettes, uncomplicated: Secondary | ICD-10-CM | POA: Insufficient documentation

## 2018-06-02 LAB — CBC WITH DIFFERENTIAL/PLATELET
ABS IMMATURE GRANULOCYTES: 0.24 10*3/uL — AB (ref 0.00–0.07)
Basophils Absolute: 0.1 10*3/uL (ref 0.0–0.1)
Basophils Relative: 0 %
EOS PCT: 0 %
Eosinophils Absolute: 0.1 10*3/uL (ref 0.0–0.5)
HEMATOCRIT: 39.3 % (ref 36.0–46.0)
Hemoglobin: 12.4 g/dL (ref 12.0–15.0)
Immature Granulocytes: 1 %
LYMPHS ABS: 2.3 10*3/uL (ref 0.7–4.0)
LYMPHS PCT: 12 %
MCH: 31.2 pg (ref 26.0–34.0)
MCHC: 31.6 g/dL (ref 30.0–36.0)
MCV: 98.7 fL (ref 80.0–100.0)
MONO ABS: 1.4 10*3/uL — AB (ref 0.1–1.0)
Monocytes Relative: 7 %
NEUTROS ABS: 15.5 10*3/uL — AB (ref 1.7–7.7)
Neutrophils Relative %: 80 %
Platelets: 333 10*3/uL (ref 150–400)
RBC: 3.98 MIL/uL (ref 3.87–5.11)
RDW: 13.6 % (ref 11.5–15.5)
WBC: 19.5 10*3/uL — ABNORMAL HIGH (ref 4.0–10.5)
nRBC: 0 % (ref 0.0–0.2)

## 2018-06-02 LAB — COMPREHENSIVE METABOLIC PANEL
ALBUMIN: 4 g/dL (ref 3.5–5.0)
ALK PHOS: 59 U/L (ref 38–126)
ALT: 17 U/L (ref 0–44)
AST: 18 U/L (ref 15–41)
Anion gap: 11 (ref 5–15)
BILIRUBIN TOTAL: 0.5 mg/dL (ref 0.3–1.2)
BUN: 10 mg/dL (ref 6–20)
CALCIUM: 8.1 mg/dL — AB (ref 8.9–10.3)
CO2: 20 mmol/L — AB (ref 22–32)
Chloride: 111 mmol/L (ref 98–111)
Creatinine, Ser: 0.55 mg/dL (ref 0.44–1.00)
GFR calc non Af Amer: >60 mL/min (ref >60)
Glucose, Bld: 138 mg/dL — ABNORMAL HIGH (ref 70–99)
Potassium: 4 mmol/L (ref 3.5–5.1)
SODIUM: 142 mmol/L (ref 135–145)
TOTAL PROTEIN: 7.3 g/dL (ref 6.5–8.1)

## 2018-06-02 LAB — RAPID URINE DRUG SCREEN, HOSP PERFORMED
AMPHETAMINES: NOT DETECTED
BENZODIAZEPINES: NOT DETECTED
Barbiturates: NOT DETECTED
Cocaine: POSITIVE — AB
Opiates: NOT DETECTED
Tetrahydrocannabinol: POSITIVE — AB

## 2018-06-02 LAB — ETHANOL: ALCOHOL ETHYL (B): 70 mg/dL — AB (ref <10)

## 2018-06-02 MED ORDER — METOCLOPRAMIDE HCL 5 MG/ML IJ SOLN
10.0000 mg | Freq: Once | INTRAMUSCULAR | Status: AC
  Administered 2018-06-02: 10 mg via INTRAVENOUS
  Filled 2018-06-02: qty 2

## 2018-06-02 MED ORDER — SODIUM CHLORIDE 0.9 % IV BOLUS
1000.0000 mL | Freq: Once | INTRAVENOUS | Status: AC
  Administered 2018-06-02: 1000 mL via INTRAVENOUS

## 2018-06-02 MED ORDER — ONDANSETRON 4 MG PO TBDP: 4.0000 mg | ORAL_TABLET | Freq: Three times a day (TID) | ORAL | 0 refills | Status: DC | PRN

## 2018-06-02 MED ORDER — ONDANSETRON HCL 4 MG/2ML IJ SOLN
4.0000 mg | Freq: Once | INTRAMUSCULAR | Status: AC
  Administered 2018-06-02: 4 mg via INTRAVENOUS
  Filled 2018-06-02: qty 2
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
## 2018-06-02 NOTE — ED Notes (Signed)
Sprite given to pt

## 2018-06-02 NOTE — ED Triage Notes (Signed)
EMS states patient initially agonal when they arrived.

## 2018-06-06 ENCOUNTER — Emergency Department (HOSPITAL_COMMUNITY)
Admission: EM | Admit: 2018-06-06 | Discharge: 2018-06-06 | Disposition: A | Discharge status: Home / Self Care | Payer: Self-pay | Attending: Emergency Medicine | Admitting: Emergency Medicine

## 2018-06-06 ENCOUNTER — Emergency Department (HOSPITAL_COMMUNITY): Payer: Self-pay

## 2018-06-06 ENCOUNTER — Encounter (HOSPITAL_COMMUNITY): Payer: Self-pay | Admitting: Emergency Medicine

## 2018-06-06 ENCOUNTER — Other Ambulatory Visit: Payer: Self-pay

## 2018-06-06 DIAGNOSIS — N939 Abnormal uterine and vaginal bleeding, unspecified: Secondary | ICD-10-CM

## 2018-06-06 DIAGNOSIS — R1084 Generalized abdominal pain: Secondary | ICD-10-CM | POA: Insufficient documentation

## 2018-06-06 DIAGNOSIS — F17210 Nicotine dependence, cigarettes, uncomplicated: Secondary | ICD-10-CM | POA: Insufficient documentation

## 2018-06-06 LAB — WET PREP, GENITAL
SPERM: NONE SEEN
Trich, Wet Prep: NONE SEEN
Yeast Wet Prep HPF POC: NONE SEEN

## 2018-06-06 LAB — CBC
HCT: 42 % (ref 36.0–46.0)
HEMOGLOBIN: 13.2 g/dL (ref 12.0–15.0)
MCH: 30 pg (ref 26.0–34.0)
MCHC: 31.4 g/dL (ref 30.0–36.0)
MCV: 95.5 fL (ref 80.0–100.0)
Platelets: 332 10*3/uL (ref 150–400)
RBC: 4.4 MIL/uL (ref 3.87–5.11)
RDW: 13.5 % (ref 11.5–15.5)
WBC: 9.8 10*3/uL (ref 4.0–10.5)
nRBC: 0 % (ref 0.0–0.2)

## 2018-06-06 LAB — BASIC METABOLIC PANEL
ANION GAP: 7 (ref 5–15)
BUN: 14 mg/dL (ref 6–20)
CO2: 25 mmol/L (ref 22–32)
Calcium: 9 mg/dL (ref 8.9–10.3)
Chloride: 106 mmol/L (ref 98–111)
Creatinine, Ser: 0.6 mg/dL (ref 0.44–1.00)
GFR calc Af Amer: >60 mL/min (ref >60)
GFR calc non Af Amer: >60 mL/min (ref >60)
Glucose, Bld: 89 mg/dL (ref 70–99)
Potassium: 3.7 mmol/L (ref 3.5–5.1)
Sodium: 138 mmol/L (ref 135–145)

## 2018-06-06 LAB — I-STAT BETA HCG BLOOD, ED (MC, WL, AP ONLY)

## 2018-06-06 MED ORDER — IOPAMIDOL (ISOVUE-300) INJECTION 61%
100.0000 mL | Freq: Once | INTRAVENOUS | Status: AC | PRN
  Administered 2018-06-06: 100 mL via INTRAVENOUS

## 2018-06-06 MED ORDER — IOPAMIDOL (ISOVUE-300) INJECTION 61%
30.0000 mL | Freq: Once | INTRAVENOUS | Status: AC | PRN
  Administered 2018-06-06: 30 mL via ORAL
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
## 2018-06-08 LAB — HIV ANTIBODY (ROUTINE TESTING W REFLEX): HIV Screen 4th Generation wRfx: NONREACTIVE

## 2018-06-08 LAB — RPR: RPR Ser Ql: NONREACTIVE

## 2018-06-08 NOTE — ED Notes (Signed)
[facility] by [facility] the incorrect swab was obtained for the GC/Chlamydia screening.  Pt will be notified.

## 2018-06-09 ENCOUNTER — Encounter: Payer: 59 | Admitting: Women's Health

## 2018-06-15 ENCOUNTER — Ambulatory Visit (INDEPENDENT_AMBULATORY_CARE_PROVIDER_SITE_OTHER): Payer: Self-pay | Admitting: Obstetrics and Gynecology

## 2018-06-15 ENCOUNTER — Encounter: Payer: Self-pay | Admitting: Obstetrics and Gynecology

## 2018-06-15 VITALS — BP 108/68 | HR 100 | Ht 63.0 in | Wt 118.9 lb

## 2018-06-15 DIAGNOSIS — N7011 Chronic salpingitis: Secondary | ICD-10-CM

## 2018-06-15 DIAGNOSIS — Z113 Encounter for screening for infections with a predominantly sexual mode of transmission: Secondary | ICD-10-CM

## 2018-06-15 LAB — POCT PREGNANCY, URINE: PREG TEST UR: NEGATIVE

## 2018-06-15 MED ORDER — DOXYCYCLINE HYCLATE 100 MG PO CAPS: 100.0000 mg | ORAL_CAPSULE | Freq: Two times a day (BID) | ORAL | 0 refills | Status: DC

## 2018-06-15 MED ORDER — CEFTRIAXONE SODIUM 250 MG IJ SOLR: 250.0000 mg | Freq: Once | INTRAMUSCULAR | Status: DC

## 2018-06-15 MED ORDER — CEFTRIAXONE SODIUM 250 MG IJ SOLR
250.0000 mg | Freq: Once | INTRAMUSCULAR | Status: DC
  Administered 2018-06-15: 250 mg via INTRAMUSCULAR
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
## 2018-06-15 NOTE — Patient Instructions (Signed)
Call [phone] to schedule your pap and mammogram.  Call [facility] [phone] to schedule your sexually transmitted infection screening.  

## 2018-06-17 ENCOUNTER — Ambulatory Visit (HOSPITAL_COMMUNITY)
Admission: RE | Admit: 2018-06-17 | Discharge: 2018-06-17 | Disposition: A | Discharge status: Home / Self Care | Payer: Self-pay | Source: Ambulatory Visit | Attending: Obstetrics and Gynecology | Admitting: Obstetrics and Gynecology

## 2018-06-17 DIAGNOSIS — N7011 Chronic salpingitis: Secondary | ICD-10-CM | POA: Insufficient documentation

## 2018-06-17 LAB — CERVICOVAGINAL ANCILLARY ONLY
Chlamydia: NEGATIVE
Neisseria Gonorrhea: NEGATIVE
Trichomonas: NEGATIVE

## 2018-07-09 ENCOUNTER — Telehealth: Payer: Self-pay | Admitting: *Deleted

## 2018-07-09 ENCOUNTER — Ambulatory Visit: Payer: Self-pay | Admitting: Obstetrics and Gynecology

## 2018-07-09 NOTE — Telephone Encounter (Signed)
Called [name] regarding her missed appointment.  [name] did not pick up.  Left message with the person who answered the phone requesting [name] call the clinic.  Per Dr. Jolayne Panther, she can reschedule her appointment if she is continuing to have symptoms.

## 2018-07-13 NOTE — Telephone Encounter (Signed)
Called pt regarding her missed appointment.  Pt did not pick up.  Left message with person who answered the phone requesting pt call the clinic.  Will send letter.
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
## 2019-09-20 IMAGING — US US PELVIS COMPLETE TRANSABD/TRANSVAG
2 series · 15 of 25 positions shown · non-contrast
Comparison: CT on 06/06/2018

CLINICAL DATA: Left adnexal cystic lesion and probable hydrosalpinx
on recent CT.

EXAM:
TRANSABDOMINAL AND TRANSVAGINAL ULTRASOUND OF PELVIS
TECHNIQUE: Both transabdominal and transvaginal ultrasound examinations of the
pelvis were performed. Transabdominal technique was performed for
global imaging of the pelvis including uterus, ovaries, adnexal
regions, and pelvic cul-de-sac. It was necessary to proceed with
endovaginal exam following the transabdominal exam to visualize the
cystic adnexal lesions.

[Series 1: us pelvis complete transabd/transvag · 82 acquisitions, 13 frames shown (1 of 2)]
[im 1/82]
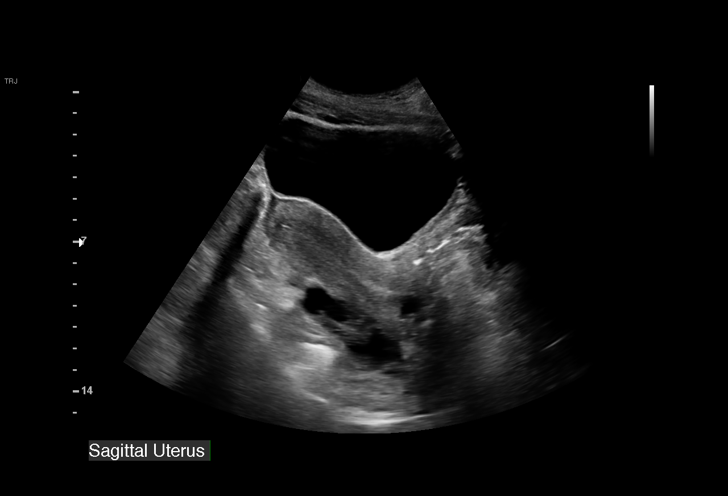
[im 8/82]
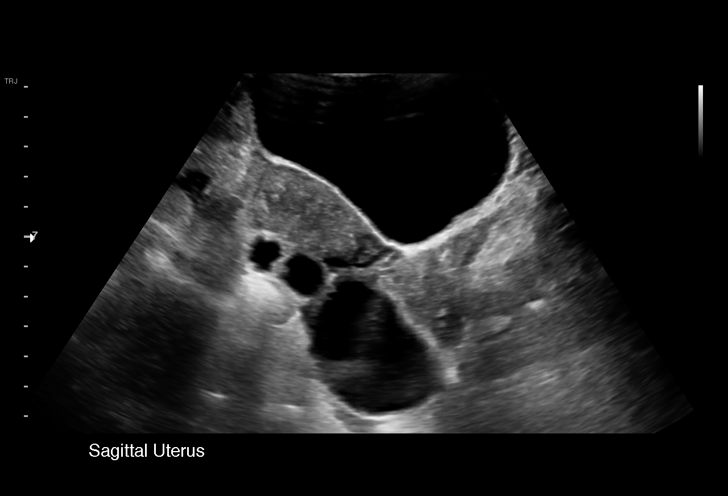
[im 16/82]
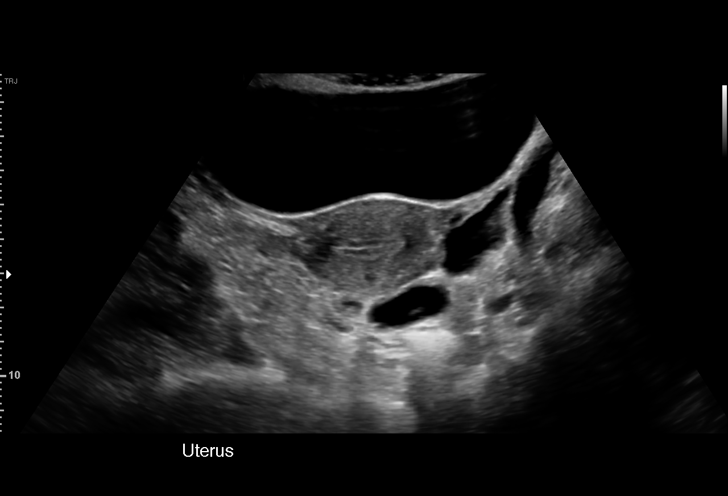
[im 20/82]
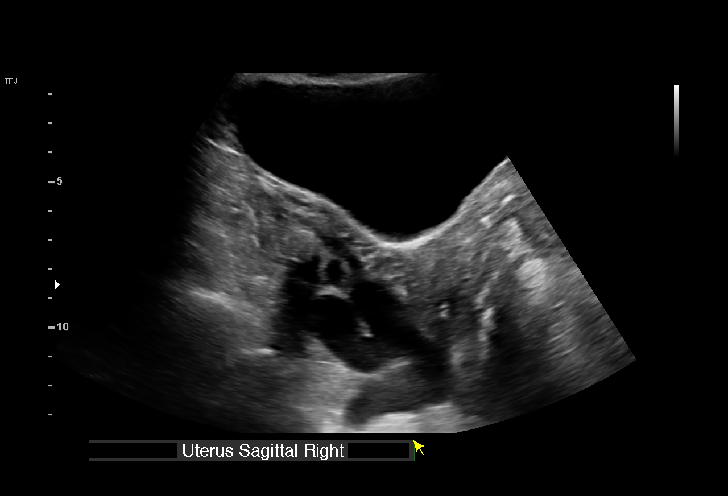
[im 28/82]
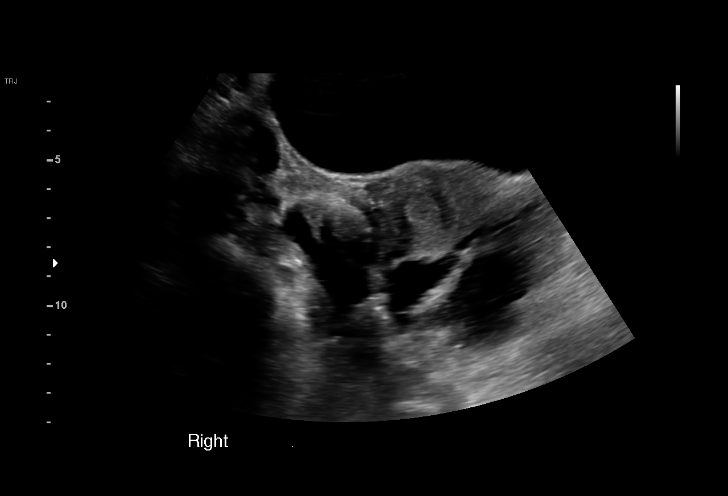
[im 35/82]
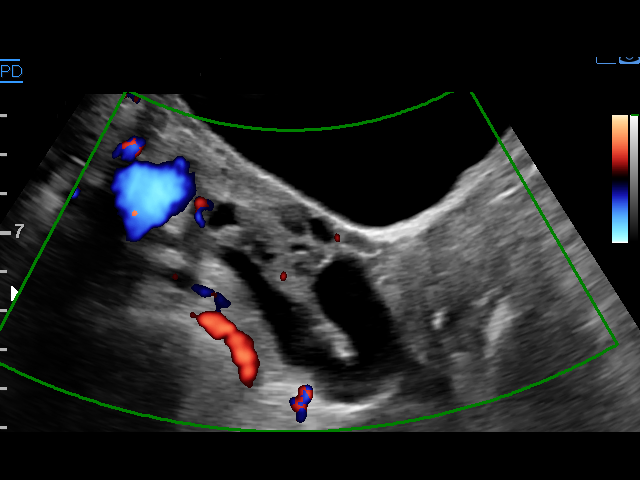
[im 39/82]
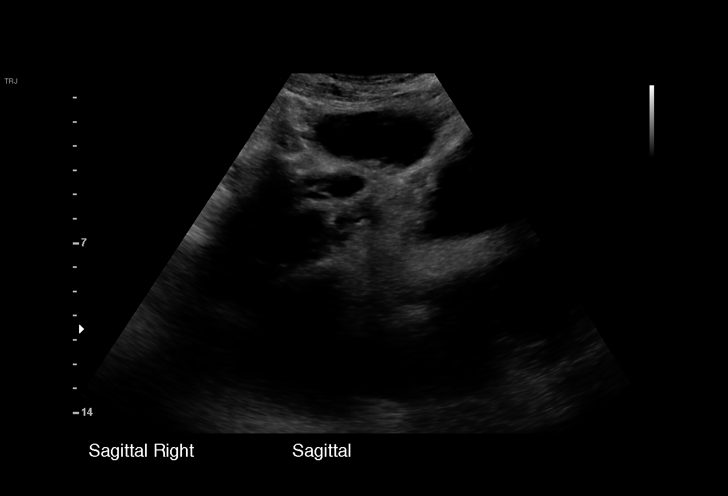
[im 47/82]
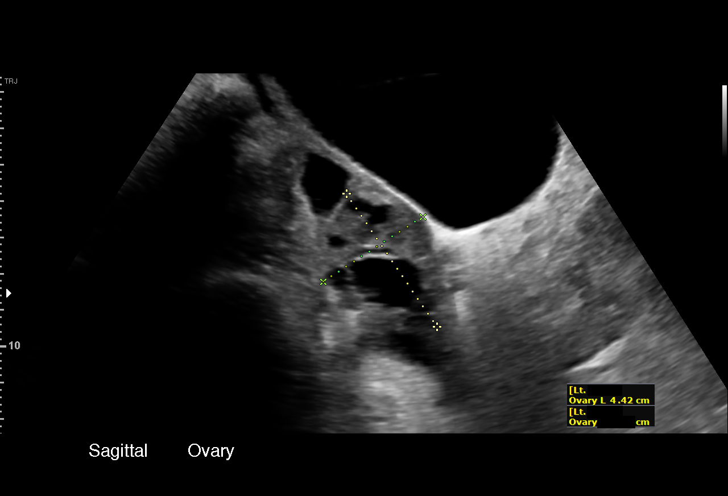
[im 55/82]
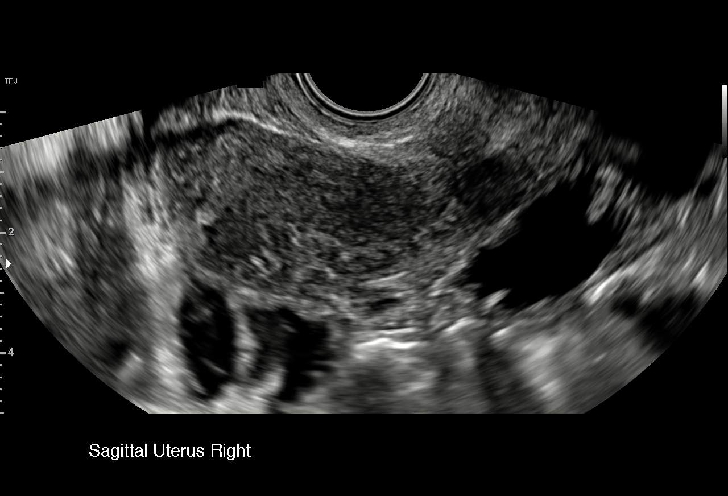
[im 58/82]
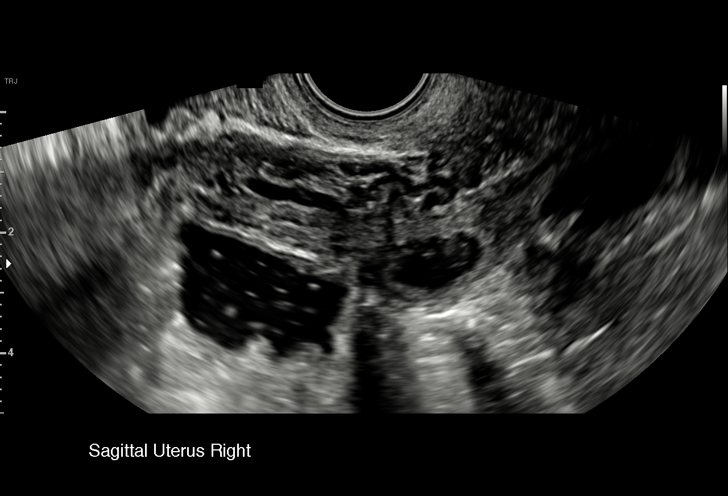
[im 66/82]
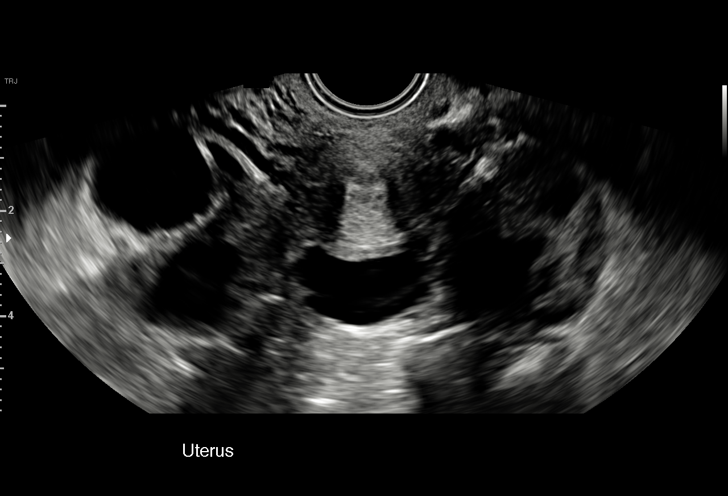
[im 74/82]
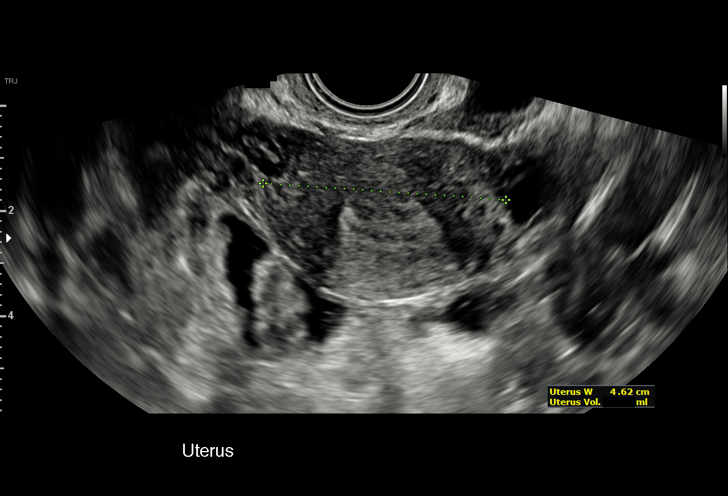
[im 78/82]
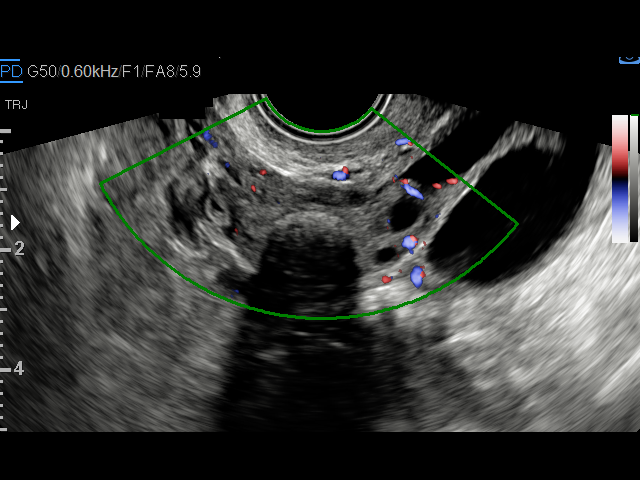

[Series 2: us pelvis complete transabd/transvag · 2 of 11 slices shown (2 of 2)]
[im 1/11]
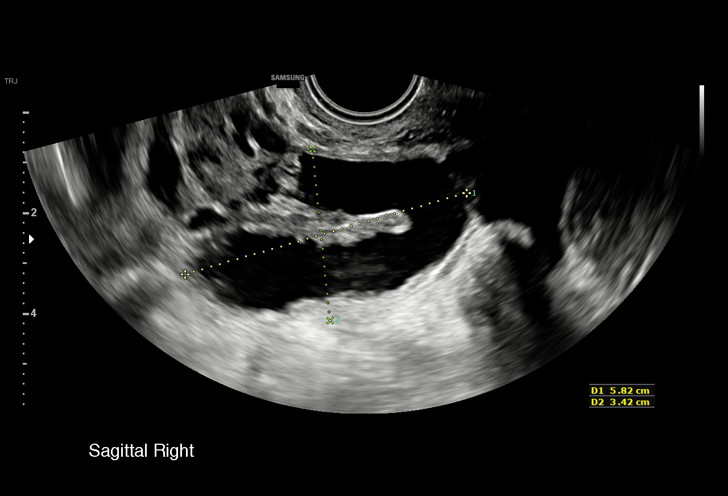
[im 11/11]
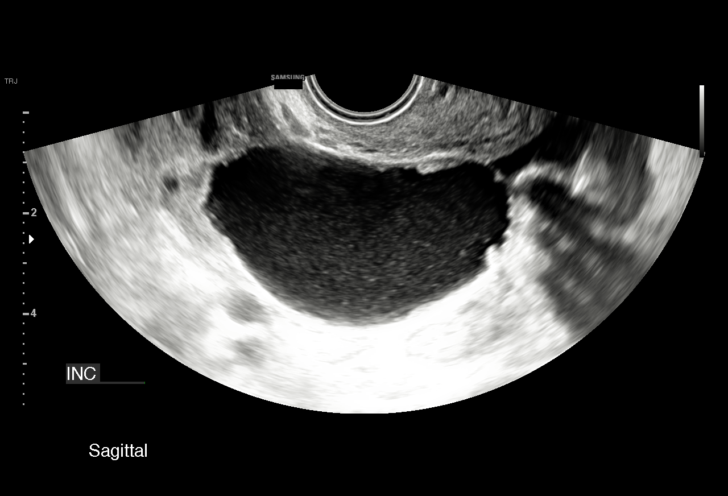

[15 of 25 positions shown; findings below may reference images not displayed]

FINDINGS: Uterus

Measurements: 8.4 x 3.3 x 4.5 cm = volume: 66 mL. No fibroids or
other mass visualized.

Endometrium

Thickness: 8 mm.  No focal abnormality visualized.

Right ovary

Measurements: 3.0 x 1.7 x 2.7 cm = volume: 7.3 mL. No ovarian mass
identified. Hyperechoic structure adjacent to the ovary most likely
represents bowel gas. No calcification seen on recent CT. A tubular
cystic structure is seen in the right adnexa measuring 5.8 x 3.4 x
4.1 cm, consistent with hydrosalpinx.

Left ovary

Measurements: 3.0 x 2.4 x 2.6 cm = volume: 9.7 mL. Normal
appearance. A tubular cystic structure is seen in the left adnexa
measuring 6.7 x 3.5 x 6.4 cm, consistent with hydrosalpinx.

Other findings

Small amount of simple free fluid noted.
IMPRESSION: Moderate bilateral hydrosalpinges.

Unremarkable appearance of both ovaries.

Normal appearance of uterus.

## 2020-01-27 ENCOUNTER — Emergency Department (HOSPITAL_COMMUNITY): Payer: Self-pay

## 2020-01-27 ENCOUNTER — Emergency Department (HOSPITAL_COMMUNITY)
Admission: EM | Admit: 2020-01-27 | Discharge: 2020-01-27 | Disposition: A | Discharge status: Home / Self Care | Payer: Self-pay | Attending: Emergency Medicine | Admitting: Emergency Medicine

## 2020-01-27 ENCOUNTER — Encounter (HOSPITAL_COMMUNITY): Payer: Self-pay | Admitting: *Deleted

## 2020-01-27 ENCOUNTER — Other Ambulatory Visit: Payer: Self-pay

## 2020-01-27 DIAGNOSIS — Z20822 Contact with and (suspected) exposure to covid-19: Secondary | ICD-10-CM | POA: Insufficient documentation

## 2020-01-27 DIAGNOSIS — B349 Viral infection, unspecified: Secondary | ICD-10-CM | POA: Insufficient documentation

## 2020-01-27 DIAGNOSIS — R519 Headache, unspecified: Secondary | ICD-10-CM | POA: Insufficient documentation

## 2020-01-27 DIAGNOSIS — F17210 Nicotine dependence, cigarettes, uncomplicated: Secondary | ICD-10-CM | POA: Insufficient documentation

## 2020-01-27 LAB — BASIC METABOLIC PANEL
Anion gap: 8 (ref 5–15)
BUN: 9 mg/dL (ref 6–20)
CO2: 24 mmol/L (ref 22–32)
Calcium: 8.9 mg/dL (ref 8.9–10.3)
Chloride: 103 mmol/L (ref 98–111)
Creatinine, Ser: 0.5 mg/dL (ref 0.44–1.00)
GFR calc Af Amer: >60 mL/min (ref >60)
GFR calc non Af Amer: >60 mL/min (ref >60)
Glucose, Bld: 101 mg/dL — ABNORMAL HIGH (ref 70–99)
Potassium: 3.3 mmol/L — ABNORMAL LOW (ref 3.5–5.1)
Sodium: 135 mmol/L (ref 135–145)

## 2020-01-27 LAB — CBC WITH DIFFERENTIAL/PLATELET
Abs Immature Granulocytes: 0.02 10*3/uL (ref 0.00–0.07)
Basophils Absolute: 0.1 10*3/uL (ref 0.0–0.1)
Basophils Relative: 1 %
Eosinophils Absolute: 0.4 10*3/uL (ref 0.0–0.5)
Eosinophils Relative: 4 %
HCT: 43.9 % (ref 36.0–46.0)
Hemoglobin: 14.9 g/dL (ref 12.0–15.0)
Immature Granulocytes: 0 %
Lymphocytes Relative: 33 %
Lymphs Abs: 3.1 10*3/uL (ref 0.7–4.0)
MCH: 31.2 pg (ref 26.0–34.0)
MCHC: 33.9 g/dL (ref 30.0–36.0)
MCV: 92 fL (ref 80.0–100.0)
Monocytes Absolute: 1.2 10*3/uL — ABNORMAL HIGH (ref 0.1–1.0)
Monocytes Relative: 12 %
Neutro Abs: 4.7 10*3/uL (ref 1.7–7.7)
Neutrophils Relative %: 50 %
Platelets: 340 10*3/uL (ref 150–400)
RBC: 4.77 MIL/uL (ref 3.87–5.11)
RDW: 13.7 % (ref 11.5–15.5)
WBC: 9.4 10*3/uL (ref 4.0–10.5)
nRBC: 0 % (ref 0.0–0.2)

## 2020-01-27 LAB — URINALYSIS, ROUTINE W REFLEX MICROSCOPIC
Bilirubin Urine: NEGATIVE
Glucose, UA: NEGATIVE mg/dL
Hgb urine dipstick: NEGATIVE
Ketones, ur: NEGATIVE mg/dL
Leukocytes,Ua: NEGATIVE
Nitrite: NEGATIVE
Protein, ur: 30 mg/dL — AB
Specific Gravity, Urine: 1.027 (ref 1.005–1.030)
pH: 5 (ref 5.0–8.0)

## 2020-01-27 LAB — SARS CORONAVIRUS 2 BY RT PCR (HOSPITAL ORDER, PERFORMED IN ~~LOC~~ HOSPITAL LAB): SARS Coronavirus 2: NEGATIVE

## 2020-01-27 LAB — TROPONIN I (HIGH SENSITIVITY): Troponin I (High Sensitivity): <2 ng/L (ref <18)

## 2020-01-27 LAB — PREGNANCY, URINE: Preg Test, Ur: NEGATIVE

## 2020-01-27 MED ORDER — SODIUM CHLORIDE 0.9 % IV BOLUS
1000.0000 mL | Freq: Once | INTRAVENOUS | Status: AC
  Administered 2020-01-27: 1000 mL via INTRAVENOUS

## 2020-01-27 MED ORDER — PROCHLORPERAZINE MALEATE 5 MG PO TABS
10.0000 mg | ORAL_TABLET | Freq: Once | ORAL | Status: AC
  Administered 2020-01-27: 10 mg via ORAL
  Filled 2020-01-27: qty 2

## 2020-01-27 MED ORDER — KETOROLAC TROMETHAMINE 15 MG/ML IJ SOLN
15.0000 mg | Freq: Once | INTRAMUSCULAR | Status: AC
  Administered 2020-01-27: 15 mg via INTRAVENOUS
  Filled 2020-01-27: qty 1

## 2020-01-27 MED ORDER — DIPHENHYDRAMINE HCL 50 MG/ML IJ SOLN
50.0000 mg | Freq: Once | INTRAMUSCULAR | Status: AC
  Administered 2020-01-27: 50 mg via INTRAVENOUS
  Filled 2020-01-27: qty 1
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
## 2020-01-27 NOTE — ED Triage Notes (Signed)
Possible Covid, CO WEAKNESS AND HEADACHE

## 2020-01-27 NOTE — Discharge Instructions (Signed)
Your labwork and chest xray were reassuring today. You have tested negative for COVID. Your symptoms are likely viral in nature. I would recommend staying home until you are symptom free. Drink plenty of fluids to stay hydrated.   Your potassium level was slightly decreased in the ED today - please increase potassium in your diet over the next week and have your potassium level rechecked by your PCP.   Return to the ED for any worsening symptoms

## 2020-01-27 NOTE — ED Notes (Signed)
Patient denies pain and is resting comfortably.  

## 2020-11-23 ENCOUNTER — Encounter: Payer: Self-pay | Admitting: *Deleted

## 2021-08-21 ENCOUNTER — Emergency Department (HOSPITAL_COMMUNITY)
Admission: EM | Admit: 2021-08-21 | Discharge: 2021-08-21 | Disposition: A | Discharge status: Home / Self Care | Payer: Self-pay | Attending: Emergency Medicine | Admitting: Emergency Medicine

## 2021-08-21 ENCOUNTER — Encounter (HOSPITAL_COMMUNITY): Payer: Self-pay | Admitting: *Deleted

## 2021-08-21 ENCOUNTER — Emergency Department (HOSPITAL_COMMUNITY): Payer: Self-pay

## 2021-08-21 DIAGNOSIS — S0990XA Unspecified injury of head, initial encounter: Secondary | ICD-10-CM

## 2021-08-21 DIAGNOSIS — S41151A Open bite of right upper arm, initial encounter: Secondary | ICD-10-CM | POA: Insufficient documentation

## 2021-08-21 DIAGNOSIS — W503XXA Accidental bite by another person, initial encounter: Secondary | ICD-10-CM

## 2021-08-21 DIAGNOSIS — S060X0A Concussion without loss of consciousness, initial encounter: Secondary | ICD-10-CM | POA: Insufficient documentation

## 2021-08-21 LAB — POC URINE PREG, ED: Preg Test, Ur: NEGATIVE

## 2021-08-21 MED ORDER — CEPHALEXIN 500 MG PO CAPS: 500.0000 mg | ORAL_CAPSULE | Freq: Two times a day (BID) | ORAL | 0 refills | Status: AC

## 2021-08-21 MED ORDER — DOXYCYCLINE HYCLATE 100 MG PO CAPS: 100.0000 mg | ORAL_CAPSULE | Freq: Two times a day (BID) | ORAL | 0 refills | Status: AC
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
## 2021-08-21 NOTE — ED Notes (Signed)
Pt has swelling and pain to right posterior upper arm, full rom, no deformity noted.
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
## 2021-09-08 ENCOUNTER — Other Ambulatory Visit: Payer: Self-pay

## 2021-09-08 ENCOUNTER — Encounter (HOSPITAL_COMMUNITY): Payer: Self-pay | Admitting: Emergency Medicine

## 2021-09-08 ENCOUNTER — Emergency Department (HOSPITAL_COMMUNITY)
Admission: EM | Admit: 2021-09-08 | Discharge: 2021-09-08 | Disposition: A | Discharge status: Home / Self Care | Payer: Self-pay | Attending: Emergency Medicine | Admitting: Emergency Medicine

## 2021-09-08 DIAGNOSIS — J039 Acute tonsillitis, unspecified: Secondary | ICD-10-CM | POA: Insufficient documentation

## 2021-09-08 LAB — GROUP A STREP BY PCR: Group A Strep by PCR: NOT DETECTED

## 2021-09-08 MED ORDER — CLINDAMYCIN HCL 150 MG PO CAPS
300.0000 mg | ORAL_CAPSULE | Freq: Once | ORAL | Status: AC
  Administered 2021-09-08: 300 mg via ORAL
  Filled 2021-09-08: qty 2

## 2021-09-08 MED ORDER — CLINDAMYCIN HCL 300 MG PO CAPS: 300.0000 mg | ORAL_CAPSULE | Freq: Four times a day (QID) | ORAL | 0 refills | Status: AC

## 2021-09-08 NOTE — Discharge Instructions (Addendum)
Return if any problems.  See your Physicain for recheck  

## 2021-09-08 NOTE — ED Provider Notes (Signed)
?[facility] ?Provider Note ? ? ?CSN: [id number] ?Arrival date & time: 09/08/21  1808 ? ?  ? ?History ? ?Chief Complaint  ?Patient presents with  ? Sore Throat  ? ? ?[name] is a 31 y.o. female. ? ?Pt complains of swollen tonsils and swelling left neck.  Pt reports pain in left ear  ? ?The history is provided by the patient. No language interpreter was used.  ?Sore Throat ?This is a new problem. The current episode started 2 days ago. The problem occurs constantly. The problem has been gradually worsening. Pertinent negatives include no shortness of breath. Nothing aggravates the symptoms. Nothing relieves the symptoms. She has tried nothing for the symptoms. The treatment provided moderate relief.  ? ?  ? ?Home Medications ?Prior to Admission medications   ?Medication Sig Start Date End Date Taking? Authorizing Provider  ?clindamycin (CLEOCIN) 300 MG capsule Take 1 capsule (300 mg total) by mouth 4 (four) times daily for 7 days. 09/08/21 09/15/21 Yes Elson Areas, PA-C  ?acetaminophen (TYLENOL) 500 MG tablet Take 500 mg by mouth every 6 (six) hours as needed for mild pain, moderate pain or headache.    [provider]  ?   ? ?Allergies    ?Gentamicin and Penicillins   ? ?Review of Systems   ?Review of Systems  ?Constitutional:  Positive for fever.  ?HENT:  Positive for sore throat.   ?Respiratory:  Negative for shortness of breath.   ?All other systems reviewed and are negative. ? ?Physical Exam ?Updated Vital Signs ?BP 129/85 (BP Location: Left Arm)   Pulse 99   Temp 98.7 ?F (37.1 ?C) (Oral)   Resp 20   Ht 5\' 3"  (1.6 m)   Wt 54.4 kg   LMP 08/20/2021   SpO2 95%   BMI 21.26 kg/m?  ?Physical Exam ?Vitals and nursing note reviewed.  ?Constitutional:   ?   General: She is not in acute distress. ?   Appearance: She is well-developed.  ?HENT:  ?   Head: Normocephalic and atraumatic.  ?   Right Ear: Tympanic membrane normal.  ?   Left Ear: Tympanic membrane normal.  ?    Mouth/Throat:  ?   Mouth: Mucous membranes are moist.  ?   Pharynx: Pharyngeal swelling, oropharyngeal exudate and posterior oropharyngeal erythema present.  ?   Tonsils: Tonsillar exudate and tonsillar abscess present.  ?Eyes:  ?   Conjunctiva/sclera: Conjunctivae normal.  ?Cardiovascular:  ?   Rate and Rhythm: Normal rate and regular rhythm.  ?   Heart sounds: No murmur heard. ?Pulmonary:  ?   Effort: Pulmonary effort is normal. No respiratory distress.  ?   Breath sounds: Normal breath sounds.  ?Abdominal:  ?   Palpations: Abdomen is soft.  ?   Tenderness: There is no abdominal tenderness.  ?Musculoskeletal:     ?   General: No swelling.  ?   Cervical back: Neck supple.  ?Skin: ?   General: Skin is warm and dry.  ?   Capillary Refill: Capillary refill takes less than 2 seconds.  ?Neurological:  ?   Mental Status: She is alert.  ?Psychiatric:     ?   Mood and Affect: Mood normal.  ? ? ?ED Results / Procedures / Treatments   ?Labs ?(all labs ordered are listed, but only abnormal results are displayed) ?Labs Reviewed  ?GROUP A STREP BY PCR  ? ? ?EKG ?None ? ?Radiology ?No results found. ? ?Procedures ?Procedures  ? ? ?  Medications Ordered in ED ?Medications  ?clindamycin (CLEOCIN) capsule 300 mg (300 mg Oral Given 09/08/21 2046)  ? ? ?ED Course/ Medical Decision Making/ A&P ?  ?                        ?Medical Decision Making ?Pt complains of swollen tonsils and left neck  ? ?Amount and/or Complexity of Data Reviewed ?Labs: ordered. Decision-making details documented in ED Course. ?   Details: Strep is negative. ? ?Risk ?Prescription drug management. ?Risk Details: Pt has an exudative tonsillitis.  Pt given clindamycin here.  Rx for clindamycin  ? ? ? ? ? ? ? ? ? ? ?Final Clinical Impression(s) / ED Diagnoses ?Final diagnoses:  ?Tonsillitis  ? ? ?Rx / DC Orders ?ED Discharge Orders   ? ?      Ordered  ?  clindamycin (CLEOCIN) 300 MG capsule  4 times daily       ? 09/08/21 2046  ? ?  ?  ? ?  ? ?An After Visit Summary  was printed and given to the patient.  ?  ?Elson Areas, PA-C ?09/08/21 2113 ? ?  ?Linwood Dibbles, MD ?09/09/21 1505 ? ?

## 2021-09-08 NOTE — ED Triage Notes (Signed)
Patient c/o sore throat, left ear pain, body aches, and chills. Per patient started x2 days ago. Denies any fevers. Patient taking Day-quil and Night-quil with no relief. Patient last had Dayquil at 1200 today.  ?

## 2021-09-08 NOTE — ED Notes (Signed)
Pt ambulated to the bathroom unassisted.  

## 2022-03-27 ENCOUNTER — Emergency Department (HOSPITAL_COMMUNITY)
Admission: EM | Admit: 2022-03-27 | Discharge: 2022-03-27 | Disposition: A | Discharge status: Home / Self Care | Payer: Self-pay | Attending: Emergency Medicine | Admitting: Emergency Medicine

## 2022-03-27 ENCOUNTER — Other Ambulatory Visit: Payer: Self-pay

## 2022-03-27 ENCOUNTER — Emergency Department (HOSPITAL_COMMUNITY): Payer: Self-pay

## 2022-03-27 ENCOUNTER — Encounter (HOSPITAL_COMMUNITY): Payer: Self-pay

## 2022-03-27 DIAGNOSIS — Z20822 Contact with and (suspected) exposure to covid-19: Secondary | ICD-10-CM | POA: Insufficient documentation

## 2022-03-27 DIAGNOSIS — F419 Anxiety disorder, unspecified: Secondary | ICD-10-CM | POA: Insufficient documentation

## 2022-03-27 DIAGNOSIS — J111 Influenza due to unidentified influenza virus with other respiratory manifestations: Secondary | ICD-10-CM | POA: Insufficient documentation

## 2022-03-27 DIAGNOSIS — R6889 Other general symptoms and signs: Secondary | ICD-10-CM

## 2022-03-27 LAB — RESP PANEL BY RT-PCR (FLU A&B, COVID) ARPGX2
Influenza A by PCR: NEGATIVE
Influenza B by PCR: NEGATIVE
SARS Coronavirus 2 by RT PCR: NEGATIVE

## 2022-03-27 NOTE — ED Triage Notes (Signed)
Headache 2-3 days. Worse when she wakes up. Had anxiety when ems arrived that has resolved.

## 2022-03-27 NOTE — ED Provider Notes (Signed)
[facility] EMERGENCY DEPARTMENT Provider Note   CSN: [id number] Arrival date & time: 03/27/22  7619     History  No chief complaint on file.   NYAZIA CANEVARI is a 31 y.o. female.  Presents to the emergency department for evaluation of flulike symptoms.  Patient reports that she has had a headache for the last 2 to 3 days associated with cough, chest congestion.  She took a home COVID test that was negative.  Tonight she started having shortness of breath, felt very anxious when this occurred.       Home Medications Prior to Admission medications   Medication Sig Start Date End Date Taking? Authorizing Provider  acetaminophen (TYLENOL) 500 MG tablet Take 500 mg by mouth every 6 (six) hours as needed for mild pain, moderate pain or headache.    [provider]      Allergies    Gentamicin and Penicillins    Review of Systems   Review of Systems  Physical Exam Updated Vital Signs BP 105/71   Pulse 88   Resp 16   Ht 5\' 3"  (1.6 m)   Wt 53 kg   LMP 02/28/2022 (Approximate)   SpO2 99%   BMI 20.70 kg/m  Physical Exam Vitals and nursing note reviewed.  Constitutional:      General: She is not in acute distress.    Appearance: She is well-developed.  HENT:     Head: Normocephalic and atraumatic.     Mouth/Throat:     Mouth: Mucous membranes are moist.  Eyes:     General: Vision grossly intact. Gaze aligned appropriately.     Extraocular Movements: Extraocular movements intact.     Conjunctiva/sclera: Conjunctivae normal.  Cardiovascular:     Rate and Rhythm: Normal rate and regular rhythm.     Pulses: Normal pulses.     Heart sounds: Normal heart sounds, S1 normal and S2 normal. No murmur heard.    No friction rub. No gallop.  Pulmonary:     Effort: Pulmonary effort is normal. No respiratory distress.     Breath sounds: Normal breath sounds.  Abdominal:     General: Bowel sounds are normal.     Palpations: Abdomen is soft.     Tenderness: There  is no abdominal tenderness. There is no guarding or rebound.     Hernia: No hernia is present.  Musculoskeletal:        General: No swelling.     Cervical back: Full passive range of motion without pain, normal range of motion and neck supple. No spinous process tenderness or muscular tenderness. Normal range of motion.     Right lower leg: No edema.     Left lower leg: No edema.  Skin:    General: Skin is warm and dry.     Capillary Refill: Capillary refill takes less than 2 seconds.     Findings: No ecchymosis, erythema, rash or wound.  Neurological:     General: No focal deficit present.     Mental Status: She is alert and oriented to person, place, and time.     GCS: GCS eye subscore is 4. GCS verbal subscore is 5. GCS motor subscore is 6.     Cranial Nerves: Cranial nerves 2-12 are intact.     Sensory: Sensation is intact.     Motor: Motor function is intact.     Coordination: Coordination is intact.  Psychiatric:        Attention and Perception: Attention  normal.        Mood and Affect: Mood normal.        Speech: Speech normal.        Behavior: Behavior normal.     ED Results / Procedures / Treatments   Labs (all labs ordered are listed, but only abnormal results are displayed) Labs Reviewed  RESP PANEL BY RT-PCR (FLU A&B, COVID) [id number]    EKG None  Radiology DG Chest 2 View  Result Date: 03/27/2022 CLINICAL DATA:  Cough and headache EXAM: CHEST - 2 VIEW COMPARISON:  01/27/2020 FINDINGS: Cardiac and mediastinal contours are unchanged. No cardiomegaly. No focal pulmonary opacity. No pleural effusion or pneumothorax. No acute osseous abnormality. IMPRESSION: No acute cardiopulmonary process. Electronically Signed   By: Merilyn Baba M.D.   On: 03/27/2022 03:44    Procedures Procedures    Medications Ordered in ED Medications - No data to display  ED Course/ Medical Decision Making/ A&P                           Medical Decision Making Amount and/or Complexity  of Data Reviewed Radiology: ordered.   Patient appears well on arrival.  Normal neurologic exam.  No red flags with her headache.  Headache is accompanied by URI symptoms.  COVID-negative.  Influenza negative.  Chest x-ray without evidence of infiltrate or other pathology.  No further work-up would be necessary, by the time results were available it was discovered that the patient had eloped.        Final Clinical Impression(s) / ED Diagnoses Final diagnoses:  Flu-like symptoms    Rx / DC Orders ED Discharge Orders     None         Tonda Wiederhold, Gwenyth Allegra, MD 03/27/22 [phone]

## 2022-07-25 ENCOUNTER — Emergency Department (HOSPITAL_COMMUNITY)
Admission: EM | Admit: 2022-07-25 | Discharge: 2022-07-26 | Disposition: A | Discharge status: Home / Self Care | Payer: Medicaid Other | Attending: Emergency Medicine | Admitting: Emergency Medicine

## 2022-07-25 ENCOUNTER — Other Ambulatory Visit: Payer: Self-pay

## 2022-07-25 DIAGNOSIS — J181 Lobar pneumonia, unspecified organism: Secondary | ICD-10-CM | POA: Insufficient documentation

## 2022-07-25 DIAGNOSIS — J189 Pneumonia, unspecified organism: Secondary | ICD-10-CM

## 2022-07-25 DIAGNOSIS — R519 Headache, unspecified: Secondary | ICD-10-CM | POA: Diagnosis present

## 2022-07-25 DIAGNOSIS — U071 COVID-19: Secondary | ICD-10-CM | POA: Insufficient documentation

## 2022-07-25 NOTE — ED Triage Notes (Signed)
Patient reports persistent headache for 3 days , denies head injury , she adds mild SOB with low grade fever yesterday / sinus congestion .

## 2022-07-26 ENCOUNTER — Emergency Department (HOSPITAL_COMMUNITY): Payer: Medicaid Other

## 2022-07-26 ENCOUNTER — Telehealth: Payer: Self-pay | Admitting: Surgery

## 2022-07-26 LAB — CBC
HCT: 41.2 % (ref 36.0–46.0)
Hemoglobin: 13.5 g/dL (ref 12.0–15.0)
MCH: 31.8 pg (ref 26.0–34.0)
MCHC: 32.8 g/dL (ref 30.0–36.0)
MCV: 96.9 fL (ref 80.0–100.0)
Platelets: 409 10*3/uL — ABNORMAL HIGH (ref 150–400)
RBC: 4.25 MIL/uL (ref 3.87–5.11)
RDW: 15.2 % (ref 11.5–15.5)
WBC: 9.1 10*3/uL (ref 4.0–10.5)
nRBC: 0 % (ref 0.0–0.2)

## 2022-07-26 LAB — RESP PANEL BY RT-PCR (RSV, FLU A&B, COVID)  RVPGX2
Influenza A by PCR: NEGATIVE
Influenza B by PCR: NEGATIVE
Resp Syncytial Virus by PCR: NEGATIVE
SARS Coronavirus 2 by RT PCR: POSITIVE — AB

## 2022-07-26 LAB — BASIC METABOLIC PANEL
Anion gap: 9 (ref 5–15)
BUN: 7 mg/dL (ref 6–20)
CO2: 26 mmol/L (ref 22–32)
Calcium: 8.9 mg/dL (ref 8.9–10.3)
Chloride: 104 mmol/L (ref 98–111)
Creatinine, Ser: 0.58 mg/dL (ref 0.44–1.00)
GFR, Estimated: >60 mL/min (ref >60)
Glucose, Bld: 100 mg/dL — ABNORMAL HIGH (ref 70–99)
Potassium: 3.6 mmol/L (ref 3.5–5.1)
Sodium: 139 mmol/L (ref 135–145)

## 2022-07-26 LAB — I-STAT BETA HCG BLOOD, ED (MC, WL, AP ONLY): I-stat hCG, quantitative: <5 m[IU]/mL (ref <5)

## 2022-07-26 MED ORDER — AMOXICILLIN-POT CLAVULANATE 875-125 MG PO TABS: 1.0000 | ORAL_TABLET | Freq: Two times a day (BID) | ORAL | 0 refills | Status: AC

## 2022-07-26 MED ORDER — AZITHROMYCIN 250 MG PO TABS: 250.0000 mg | ORAL_TABLET | Freq: Every day | ORAL | 0 refills | Status: DC

## 2022-07-26 MED ORDER — ACETAMINOPHEN 325 MG PO TABS
650.0000 mg | ORAL_TABLET | Freq: Once | ORAL | Status: AC
  Administered 2022-07-26: 650 mg via ORAL
  Filled 2022-07-26: qty 2

## 2022-07-26 NOTE — Discharge Instructions (Signed)
You were diagnosed with COVID-19.  It also appears you may have a mild left lower lobe pneumonia.  I have prescribed antibiotics to treat the pneumonia.  Please complete the entire course of antibiotics.  Please take over-the-counter medication such as acetaminophen and ibuprofen as needed for fever and pain control.  Currently the recommended isolation period is 5 days.  If you have fevers 100 or above you should extend your isolation.  Please follow-up with me with your primary care provider.  I do recommend follow-up with them in 3 to 4 weeks for repeat chest x-ray to ensure resolution of pneumonia/atelectasis.

## 2022-07-26 NOTE — ED Provider Notes (Signed)
[facility] Provider Note   CSN: [id number] Arrival date & time: 07/25/22  2334     History  Chief Complaint  Patient presents with   Headache    [name] is a 32 y.o. female.  Patient presents to the hospital complaining of 3 days of persistent headache with low-grade fever, sinus congestion, cough, and mild shortness of breath.  Patient denies abdominal pain, nausea, vomiting, chest pain.  Past medical history significant for concussion and previous appendectomy  HPI     Home Medications Prior to Admission medications   Medication Sig Start Date End Date Taking? Authorizing Provider  acetaminophen (TYLENOL) 500 MG tablet Take 500 mg by mouth every 6 (six) hours as needed for mild pain, moderate pain or headache.    [provider]  amoxicillin-clavulanate (AUGMENTIN) 875-125 MG tablet Take 1 tablet by mouth every 12 (twelve) hours for 5 days. 07/26/22 07/31/22 Yes Dorothyann Peng, PA-C  azithromycin (ZITHROMAX) 250 MG tablet Take 1 tablet (250 mg total) by mouth daily. Take first 2 tablets together, then 1 every day until finished. 07/26/22  Yes Dorothyann Peng, PA-C      Allergies    Gentamicin and Penicillins    Review of Systems   Review of Systems  Constitutional:  Positive for fever.  HENT:  Positive for congestion.   Respiratory:  Positive for cough and shortness of breath.   Cardiovascular:  Negative for chest pain.  Gastrointestinal:  Negative for abdominal pain, nausea and vomiting.  Genitourinary:  Negative for dysuria.  Neurological:  Positive for headaches.    Physical Exam Updated Vital Signs BP 117/84 (BP Location: Right Arm)   Pulse 91   Temp 97.8 F (36.6 C)   Resp 18   LMP 07/12/2022   SpO2 97%  Physical Exam Vitals and nursing note reviewed.  Constitutional:      General: She is not in acute distress.    Appearance: She is well-developed.  HENT:     Head: Normocephalic and  atraumatic.  Eyes:     Conjunctiva/sclera: Conjunctivae normal.  Cardiovascular:     Rate and Rhythm: Normal rate and regular rhythm.     Heart sounds: No murmur heard. Pulmonary:     Effort: Pulmonary effort is normal. No respiratory distress.     Breath sounds: Rhonchi (Lower left) present.  Abdominal:     Palpations: Abdomen is soft.     Tenderness: There is no abdominal tenderness.  Musculoskeletal:        General: No swelling.     Cervical back: Neck supple.  Skin:    General: Skin is warm and dry.     Capillary Refill: Capillary refill takes less than 2 seconds.  Neurological:     Mental Status: She is alert.  Psychiatric:        Mood and Affect: Mood normal.     ED Results / Procedures / Treatments   Labs (all labs ordered are listed, but only abnormal results are displayed) Labs Reviewed  RESP PANEL BY RT-PCR (RSV, FLU A&B, COVID)  RVPGX2 - Abnormal; Notable for the following components:      Result Value   SARS Coronavirus 2 by RT PCR POSITIVE (*)    All other components within normal limits  CBC - Abnormal; Notable for the following components:   Platelets 409 (*)    All other components within normal limits  BASIC METABOLIC PANEL - Abnormal; Notable for the  following components:   Glucose, Bld 100 (*)    All other components within normal limits  I-STAT BETA HCG BLOOD, ED ([facility], [facility], [facility] ONLY)    EKG None  Radiology DG Chest 2 View  Result Date: 07/26/2022 CLINICAL DATA:  Shortness of breath. EXAM: CHEST - 2 VIEW COMPARISON:  Chest radiograph dated 03/27/2022. FINDINGS: There is mild eventration of the left hemidiaphragm with posterior left lung base probable atelectasis. Pneumonia is not excluded. Clinical correlation is recommended. The right lung is clear. No pleural effusion or pneumothorax. The cardiac silhouette is within normal limits. No acute osseous pathology. IMPRESSION: Left lung base atelectasis versus pneumonia. Electronically Signed   By: Elgie Collard M.D.   On: 07/26/2022 00:37   CT Head Wo Contrast  Result Date: 07/26/2022 CLINICAL DATA:  Headache, sudden, severe EXAM: CT HEAD WITHOUT CONTRAST TECHNIQUE: Contiguous axial images were obtained from the base of the skull through the vertex without intravenous contrast. RADIATION DOSE REDUCTION: This exam was performed according to the departmental dose-optimization program which includes automated exposure control, adjustment of the mA and/or kV according to patient size and/or use of iterative reconstruction technique. COMPARISON:  CT head 12/21/2014 FINDINGS: Brain: No evidence of large-territorial acute infarction. No parenchymal hemorrhage. No mass lesion. No extra-axial collection. No mass effect or midline shift. No hydrocephalus. Basilar cisterns are patent. Vascular: No hyperdense vessel. Skull: No acute fracture or focal lesion. Sinuses/Orbits: Mild right maxillary sinus mucosal thickening. Mild right frontal sinus mucosal thickening. Otherwise paranasal sinuses and mastoid air cells are clear. The orbits are unremarkable. Other: None. IMPRESSION: No acute intracranial abnormality. Electronically Signed   By: Tish Frederickson M.D.   On: 07/26/2022 00:29    Procedures Procedures    Medications Ordered in ED Medications  acetaminophen (TYLENOL) tablet 650 mg (650 mg Oral Given 07/26/22 0029)    ED Course/ Medical Decision Making/ A&P                             Medical Decision Making  This patient presents to the ED for concern of upper respiratory symptoms, this involves an extensive number of treatment options, and is a complaint that carries with it a high risk of complications and morbidity.  The differential diagnosis includes COVID-19, influenza, RSV, pneumonia, and others   Co morbidities that complicate the patient evaluation  None   Additional history obtained:  Additional history obtained from visitor at bedside   Lab Tests:  I Ordered, and personally  interpreted labs.  The pertinent results include: Positive COVID-19 test   Imaging Studies ordered:  I ordered imaging studies including CT head and chest x-ray I independently visualized and interpreted imaging which showed no acute abnormality on CT head.  Chest x-ray with left lower lobe atelectasis versus pneumonia I agree with the radiologist interpretation   Problem List / ED Course / Critical interventions / Medication management   I ordered medication including Tylenol for headache  Reevaluation of the patient after these medicines showed that the patient improved I have reviewed the patients home medicines and have made adjustments as needed   Test / Admission - Considered:  The patient tested positive for Covid-19. This explains the majority of her symptoms. She has been having some mild left sided pain with breathing and has rhonchi, minimally diminished sounds left lower lung. Plan to treat for early CAP with antibiotics.          Final  Clinical Impression(s) / ED Diagnoses Final diagnoses:  COVID-19  Community acquired pneumonia of left lower lobe of lung    Rx / DC Orders ED Discharge Orders          Ordered    amoxicillin-clavulanate (AUGMENTIN) 875-125 MG tablet  Every 12 hours        07/26/22 0717    azithromycin (ZITHROMAX) 250 MG tablet  Daily        07/26/22 0717              Dorothyann Peng, PA-C 07/26/22 Echo, Ankit, MD 07/28/22 [phone]

## 2022-07-26 NOTE — Telephone Encounter (Signed)
ED RN Care Manager received a from patient concerning not being able to get amoxicillin filled by pharmacy due documented allergy of penicillin at the pharmacy.  Pharmacy requesting a different antibiotic

## 2022-07-26 NOTE — ED Provider Triage Note (Signed)
Emergency Medicine Provider Triage Evaluation Note  [name] , a 32 y.o. female  was evaluated in triage.  Pt complains of shortness of breath, dizziness, persistent headache with photosensitivity, low-grade fever, sinus congestion.  She denies any chest pain, numbness, tingling, vision changes.  She reports previous history of concussion, no previous history of head bleed.  She does not take any blood thinners..  Review of Systems  Positive: Headache, congestion, shortness of breath. Negative: Chest pain, numbness, tingling  Physical Exam  BP (!) 120/106 (BP Location: Right Arm)   Pulse (!) 101   Temp 98 F (36.7 C) (Oral)   Resp 16   LMP 07/12/2022   SpO2 99%  Gen:   Awake, no distress   Resp:  Normal effort  MSK:   Moves extremities without difficulty  Other:  Moves all 4 limbs spontaneously, CN II through XII grossly intact, can ambulate without difficulty, intact sensation throughout.   Medical Decision Making  Medically screening exam initiated at 12:12 AM.  Appropriate orders placed.  [name] was informed that the remainder of the evaluation will be completed by another provider, this initial triage assessment does not replace that evaluation, and the importance of remaining in the ED until their evaluation is complete.  Workup initiated in triage    Anselmo Pickler, [place] 07/26/22 0013

## 2022-07-27 ENCOUNTER — Telehealth (HOSPITAL_COMMUNITY): Payer: Self-pay | Admitting: Student

## 2022-07-27 DIAGNOSIS — J189 Pneumonia, unspecified organism: Secondary | ICD-10-CM

## 2022-07-27 MED ORDER — LEVOFLOXACIN 500 MG PO TABS: 750.0000 mg | ORAL_TABLET | Freq: Every day | ORAL | 0 refills | Status: AC

## 2022-07-27 NOTE — Telephone Encounter (Signed)
Patient called stating pharmacy wouldn't fill previous prescription due to rash from penicillins. Levofloxacin sent

## 2022-08-30 DIAGNOSIS — Z419 Encounter for procedure for purposes other than remedying health state, unspecified: Secondary | ICD-10-CM | POA: Diagnosis not present

## 2022-08-31 ENCOUNTER — Encounter (HOSPITAL_COMMUNITY): Payer: Self-pay | Admitting: *Deleted

## 2022-08-31 ENCOUNTER — Emergency Department (HOSPITAL_COMMUNITY)
Admission: EM | Admit: 2022-08-31 | Discharge: 2022-08-31 | Disposition: A | Discharge status: Home / Self Care | Payer: Medicaid Other | Attending: Student | Admitting: Student

## 2022-08-31 ENCOUNTER — Other Ambulatory Visit: Payer: Self-pay

## 2022-08-31 DIAGNOSIS — J069 Acute upper respiratory infection, unspecified: Secondary | ICD-10-CM | POA: Insufficient documentation

## 2022-08-31 DIAGNOSIS — B9789 Other viral agents as the cause of diseases classified elsewhere: Secondary | ICD-10-CM | POA: Diagnosis not present

## 2022-08-31 DIAGNOSIS — R059 Cough, unspecified: Secondary | ICD-10-CM | POA: Diagnosis not present

## 2022-08-31 DIAGNOSIS — Z20822 Contact with and (suspected) exposure to covid-19: Secondary | ICD-10-CM | POA: Insufficient documentation

## 2022-08-31 LAB — RESP PANEL BY RT-PCR (RSV, FLU A&B, COVID)  RVPGX2
Influenza A by PCR: NEGATIVE
Influenza B by PCR: NEGATIVE
Resp Syncytial Virus by PCR: NEGATIVE
SARS Coronavirus 2 by RT PCR: NEGATIVE

## 2022-08-31 NOTE — Discharge Instructions (Signed)
Likely a viral infection, recommend over-the-counter pain medications like ibuprofen Tylenol for fever and pain control, nasal decongestions like Flonase and Zyrtec, Mucinex for cough.  If not eating recommend supplementing with Gatorade to help with electrolyte supplementation.  Follow-up PCP for further evaluation.  Come back to the emergency department if you develop chest pain, shortness of breath, severe abdominal pain, uncontrolled nausea, vomiting, diarrhea.

## 2022-08-31 NOTE — ED Provider Notes (Signed)
[facility] Provider Note   CSN: [id number] Arrival date & time: 08/31/22  2025     History  Chief Complaint  Patient presents with   Cough    [name] is a 32 y.o. female.  HPI   Patient without significant medical history presenting with complaints of URI-like symptoms.  Patient states that about 2 days ago, she endorses slight headaches, congestion, productive cough, general body aches, no endorsing chest pain, no stomach pains and nausea no vomiting still tolerating p.o.  She states that she does feel slightly short of breath but this is only at nighttime this is due to the congestion, does not endorse pleuritic chest pain, no current peripheral edema, no history of PEs or DVTs she is currently not on oral birth control.  She is not immunocompromise.  She denies any recent sick contacts.  Home Medications Prior to Admission medications   Medication Sig Start Date End Date Taking? Authorizing Provider  acetaminophen (TYLENOL) 500 MG tablet Take 500 mg by mouth every 6 (six) hours as needed for mild pain, moderate pain or headache.    [provider]  azithromycin (ZITHROMAX) 250 MG tablet Take 1 tablet (250 mg total) by mouth daily. Take first 2 tablets together, then 1 every day until finished. 07/26/22   Dorothyann Peng, PA-C      Allergies    Gentamicin and Penicillins    Review of Systems   Review of Systems  Constitutional:  Positive for chills and fever.  HENT:  Positive for congestion.   Respiratory:  Positive for cough. Negative for shortness of breath.   Cardiovascular:  Negative for chest pain.  Gastrointestinal:  Negative for abdominal pain.  Neurological:  Negative for headaches.    Physical Exam Updated Vital Signs BP 128/89 (BP Location: Right Arm)   Pulse 92   Temp 98 F (36.7 C) (Oral)   Resp 20   Ht '5\' 3"'$  (1.6 m)   Wt 53 kg   LMP 08/29/2022   SpO2 95%   BMI 20.70 kg/m  Physical  Exam Vitals and nursing note reviewed.  Constitutional:      General: She is not in acute distress.    Appearance: She is not ill-appearing.  HENT:     Head: Normocephalic and atraumatic.     Nose: Congestion present.     Comments: She had noted congestion both nares bilaterally, erythematous turbinates.    Mouth/Throat:     Mouth: Mucous membranes are moist.     Pharynx: Oropharynx is clear. No oropharyngeal exudate or posterior oropharyngeal erythema.     Comments: No trismus no torticollis no oral edema present, tongue uvula both midline controlling oral secretions intact both equal symmetric bilaterally, no submandibular swelling no muffled tone voice. Eyes:     Conjunctiva/sclera: Conjunctivae normal.  Cardiovascular:     Rate and Rhythm: Normal rate and regular rhythm.     Pulses: Normal pulses.     Heart sounds: No murmur heard.    No friction rub. No gallop.  Pulmonary:     Effort: No respiratory distress.     Breath sounds: No wheezing, rhonchi or rales.  Musculoskeletal:     Right lower leg: No edema.     Left lower leg: No edema.     Comments: No unilateral leg swelling no calf tenderness no palpable cords.  Skin:    General: Skin is warm and dry.  Neurological:  Mental Status: She is alert.  Psychiatric:        Mood and Affect: Mood normal.     ED Results / Procedures / Treatments   Labs (all labs ordered are listed, but only abnormal results are displayed) Labs Reviewed  RESP PANEL BY RT-PCR (RSV, FLU A&B, COVID)  RVPGX2    EKG None  Radiology No results found.  Procedures Procedures    Medications Ordered in ED Medications - No data to display  ED Course/ Medical Decision Making/ A&P                             Medical Decision Making  This patient presents to the ED for concern of congestion, cough, this involves an extensive number of treatment options, and is a complaint that carries with it a high risk of complications and morbidity.   The differential diagnosis includes viral URI, pneumonia, PE    Additional history obtained:  Additional history obtained from N/A External records from outside source obtained and reviewed including recent ER notes   Co morbidities that complicate the patient evaluation  N/A  Social Determinants of Health:  N/A    Lab Tests:  I Ordered, and personally interpreted labs.  The pertinent results include: Respiratory panel pending   Imaging Studies ordered:  I ordered imaging studies including N/A I independently visualized and interpreted imaging which showed n/a I agree with the radiologist interpretation   Cardiac Monitoring:  The patient was maintained on a cardiac monitor.  I personally viewed and interpreted the cardiac monitored which showed an underlying rhythm of: n/a   Medicines ordered and prescription drug management:  I ordered medication including n/a I have reviewed the patients home medicines and have made adjustments as needed  Critical Interventions:  N/a   Reevaluation:  Presents with URI-like symptoms benign physical exam, she is agreement discharge at this time.  Consultations Obtained:  N/a    Test Considered:  Chest x-ray-deferred my special for pneumonia is low at this time, lung sounds are clear bilaterally, she is nontoxic-appearing, presentation atypical of etiology, seems more consistent with viral URI.    Rule out Low suspicion for systemic infection as patient is nontoxic-appearing, vital signs reassuring, no obvious source infection noted on exam.  Low suspicion for pneumonia as lung sounds are clear bilaterally.  I have low suspicion for PE as patient denies pleuritic chest pain, shortness of breath, patient is PERC. low suspicion for strep throat as oropharynx was visualized, no erythema or exudates noted.  Low suspicion patient would need  hospitalized due to viral infection or Covid as vital signs reassuring, patient is  not in respiratory distress.      Dispostion and problem list  After consideration of the diagnostic results and the patients response to treatment, I feel that the patent would benefit from discharge.  Viral URI-likely this is a viral infection, I would not recommend Tamiflu patient is outside the treatment window, nor would I recommend antivirals for COVID as she is not immunocompromise, she has low risk factors for adverse outcome.  Will recommend symptom management follow-up with PCP for further evaluation if needed.            Final Clinical Impression(s) / ED Diagnoses Final diagnoses:  Viral URI with cough    Rx / DC Orders ED Discharge Orders     None         Marcello Fennel, PA-C  08/31/22 2236    Lennice Sites, DO 08/31/22 2304

## 2022-08-31 NOTE — ED Triage Notes (Signed)
The pt is c/o head stuffiness   and head pain cold cough  unknown temp    lmp yesterday

## 2022-09-30 DIAGNOSIS — Z419 Encounter for procedure for purposes other than remedying health state, unspecified: Secondary | ICD-10-CM | POA: Diagnosis not present

## 2022-10-09 DIAGNOSIS — E559 Vitamin D deficiency, unspecified: Secondary | ICD-10-CM | POA: Diagnosis not present

## 2022-10-09 DIAGNOSIS — Z0130 Encounter for examination of blood pressure without abnormal findings: Secondary | ICD-10-CM | POA: Diagnosis not present

## 2022-10-09 DIAGNOSIS — N63 Unspecified lump in unspecified breast: Secondary | ICD-10-CM | POA: Diagnosis not present

## 2022-10-09 DIAGNOSIS — Z0000 Encounter for general adult medical examination without abnormal findings: Secondary | ICD-10-CM | POA: Diagnosis not present

## 2022-10-09 DIAGNOSIS — R5383 Other fatigue: Secondary | ICD-10-CM | POA: Diagnosis not present

## 2022-10-09 DIAGNOSIS — F17210 Nicotine dependence, cigarettes, uncomplicated: Secondary | ICD-10-CM | POA: Diagnosis not present

## 2022-10-09 DIAGNOSIS — Z6821 Body mass index (BMI) 21.0-21.9, adult: Secondary | ICD-10-CM | POA: Diagnosis not present

## 2022-10-23 DIAGNOSIS — Z0130 Encounter for examination of blood pressure without abnormal findings: Secondary | ICD-10-CM | POA: Diagnosis not present

## 2022-10-23 DIAGNOSIS — E559 Vitamin D deficiency, unspecified: Secondary | ICD-10-CM | POA: Diagnosis not present

## 2022-10-23 DIAGNOSIS — N63 Unspecified lump in unspecified breast: Secondary | ICD-10-CM | POA: Diagnosis not present

## 2022-10-23 DIAGNOSIS — Z6822 Body mass index (BMI) 22.0-22.9, adult: Secondary | ICD-10-CM | POA: Diagnosis not present

## 2022-10-23 DIAGNOSIS — F17210 Nicotine dependence, cigarettes, uncomplicated: Secondary | ICD-10-CM | POA: Diagnosis not present

## 2022-10-23 DIAGNOSIS — Z1151 Encounter for screening for human papillomavirus (HPV): Secondary | ICD-10-CM | POA: Diagnosis not present

## 2022-10-30 DIAGNOSIS — Z419 Encounter for procedure for purposes other than remedying health state, unspecified: Secondary | ICD-10-CM | POA: Diagnosis not present

## 2022-11-01 DIAGNOSIS — N644 Mastodynia: Secondary | ICD-10-CM | POA: Diagnosis not present

## 2022-11-01 DIAGNOSIS — R92343 Mammographic extreme density, bilateral breasts: Secondary | ICD-10-CM | POA: Diagnosis not present

## 2022-11-30 DIAGNOSIS — Z419 Encounter for procedure for purposes other than remedying health state, unspecified: Secondary | ICD-10-CM | POA: Diagnosis not present

## 2022-12-30 DIAGNOSIS — Z419 Encounter for procedure for purposes other than remedying health state, unspecified: Secondary | ICD-10-CM | POA: Diagnosis not present

## 2023-01-04 ENCOUNTER — Other Ambulatory Visit: Payer: Self-pay

## 2023-01-04 ENCOUNTER — Encounter (HOSPITAL_COMMUNITY): Payer: Self-pay

## 2023-01-04 ENCOUNTER — Emergency Department (HOSPITAL_COMMUNITY)
Admission: EM | Admit: 2023-01-04 | Discharge: 2023-01-04 | Disposition: A | Discharge status: Home / Self Care | Payer: Medicaid Other | Attending: Emergency Medicine | Admitting: Emergency Medicine

## 2023-01-04 DIAGNOSIS — Y9302 Activity, running: Secondary | ICD-10-CM | POA: Insufficient documentation

## 2023-01-04 DIAGNOSIS — S0990XA Unspecified injury of head, initial encounter: Secondary | ICD-10-CM | POA: Diagnosis present

## 2023-01-04 DIAGNOSIS — W01198A Fall on same level from slipping, tripping and stumbling with subsequent striking against other object, initial encounter: Secondary | ICD-10-CM | POA: Insufficient documentation

## 2023-01-04 DIAGNOSIS — S060X0A Concussion without loss of consciousness, initial encounter: Secondary | ICD-10-CM | POA: Diagnosis not present

## 2023-01-04 LAB — POC URINE PREG, ED: Preg Test, Ur: NEGATIVE

## 2023-01-04 MED ORDER — ONDANSETRON 4 MG PO TBDP: 4.0000 mg | ORAL_TABLET | Freq: Three times a day (TID) | ORAL | 0 refills | Status: DC | PRN

## 2023-01-04 MED ORDER — MELOXICAM 15 MG PO TABS: 15.0000 mg | ORAL_TABLET | Freq: Every day | ORAL | 0 refills | Status: DC

## 2023-01-04 NOTE — ED Triage Notes (Signed)
Pt arrived via POV from home c/o left lateral head injury where Pt reports she was running out to her vehicle, tripped on her sandals and hit her head on her vehicle. Pt reports she did not have LOC but did have temporary blurry vision. Pt reports last taking 600mg  Advil 2 hrs ago PTA. Pt ambulatory in Triage but does endorse some nausea and increased lethargy.

## 2023-01-04 NOTE — ED Provider Notes (Signed)
[facility] Provider Note   CSN: [id number] Arrival date & time: 01/04/23  2021     History  Chief Complaint  Patient presents with   [name]    [name] is a 32 y.o. female.   Fall   This patient is a 32 year old female presenting with a head injury that occurred about 28 hours ago, she was running to her car when she tripped and fell bumping the left frontal parietal aspect of her scalp on the car door, she had no loss of consciousness but has had a bit of dizziness since that time especially when she bends over.  She has ongoing headache but no changes in her vision.  No nausea or vomiting, she is able to ambulate without difficulty.    Home Medications Prior to Admission medications   Medication Sig Start Date End Date Taking? Authorizing Provider  meloxicam (MOBIC) 15 MG tablet Take 1 tablet (15 mg total) by mouth daily for 14 days. 01/04/23 01/18/23 Yes Eber Hong, MD  ondansetron (ZOFRAN-ODT) 4 MG disintegrating tablet Take 1 tablet (4 mg total) by mouth every 8 (eight) hours as needed for nausea. 01/04/23  Yes Eber Hong, MD  acetaminophen (TYLENOL) 500 MG tablet Take 500 mg by mouth every 6 (six) hours as needed for mild pain, moderate pain or headache.    [provider]  azithromycin (ZITHROMAX) 250 MG tablet Take 1 tablet (250 mg total) by mouth daily. Take first 2 tablets together, then 1 every day until finished. 07/26/22   Darrick Grinder, PA-C      Allergies    Gentamicin and Penicillins    Review of Systems   Review of Systems  All other systems reviewed and are negative.   Physical Exam Updated Vital Signs BP 105/70 (BP Location: Right Arm)   Pulse 81   Temp 98.1 F (36.7 C) (Oral)   Resp 15   Ht 1.6 m (5\' 3" )   Wt 57.2 kg   LMP 12/28/2022 (Approximate)   SpO2 98%   BMI 22.32 kg/m  Physical Exam Vitals and nursing note reviewed.  Constitutional:      General: She is not in acute  distress.    Appearance: She is well-developed.  HENT:     Head: Normocephalic.     Comments: Area of tenderness to the left frontoparietal scalp but no hematoma no depression, no bruising, no lacerations, no ecchymosis, no hemotympanum, no malocclusion    Mouth/Throat:     Pharynx: No oropharyngeal exudate.  Eyes:     General: No scleral icterus.       Right eye: No discharge.        Left eye: No discharge.     Conjunctiva/sclera: Conjunctivae normal.     Pupils: Pupils are equal, round, and reactive to light.  Neck:     Thyroid: No thyromegaly.     Vascular: No JVD.  Cardiovascular:     Rate and Rhythm: Normal rate and regular rhythm.     Heart sounds: Normal heart sounds. No murmur heard.    No friction rub. No gallop.  Pulmonary:     Effort: Pulmonary effort is normal. No respiratory distress.     Breath sounds: Normal breath sounds. No wheezing or rales.  Abdominal:     General: Bowel sounds are normal. There is no distension.     Palpations: Abdomen is soft. There is no mass.     Tenderness: There is no  abdominal tenderness.  Musculoskeletal:        General: No tenderness. Normal range of motion.     Cervical back: Normal range of motion and neck supple.  Lymphadenopathy:     Cervical: No cervical adenopathy.  Skin:    General: Skin is warm and dry.     Findings: No erythema or rash.  Neurological:     Mental Status: She is alert.     Coordination: Coordination normal.     Comments: Awake alert and able to follow commands, moves all 4 extremities without difficulty, she is able to ambulate without difficulty, she sits up in the bed with normal balance, she has normal finger-nose-finger normal speech, normal cranial nerves III through XII  Psychiatric:        Behavior: Behavior normal.     ED Results / Procedures / Treatments   Labs (all labs ordered are listed, but only abnormal results are displayed) Labs Reviewed  POC URINE PREG, ED     EKG None  Radiology No results found.  Procedures Procedures    Medications Ordered in ED Medications - No data to display  ED Course/ Medical Decision Making/ A&P                             Medical Decision Making Risk Prescription drug management.   Exam is unremarkable, this is all consistent with a concussion, I do not think she needs a CT scan though I considered and she has a normal neurologic exam.  She has vital signs which are unremarkable, she will be treated with supportive care including anti-inflammatories and Zofran, the patient is agreeable to the plan.        Final Clinical Impression(s) / ED Diagnoses Final diagnoses:  Concussion without loss of consciousness, initial encounter    Rx / DC Orders ED Discharge Orders          Ordered    ondansetron (ZOFRAN-ODT) 4 MG disintegrating tablet  Every 8 hours PRN        01/04/23 2133    meloxicam (MOBIC) 15 MG tablet  Daily        01/04/23 2133              Eber Hong, MD 01/04/23 2136

## 2023-01-04 NOTE — Discharge Instructions (Signed)
Meloxicam once a day for pain  Zofran every 6 hours as needed for nausea, see your doctor within 1 week if no better, ER for worsening symptoms

## 2023-01-06 ENCOUNTER — Encounter (HOSPITAL_COMMUNITY): Payer: Medicaid Other

## 2023-01-06 ENCOUNTER — Encounter (HOSPITAL_COMMUNITY): Payer: Self-pay

## 2023-01-06 ENCOUNTER — Emergency Department (HOSPITAL_COMMUNITY): Payer: Medicaid Other

## 2023-01-06 ENCOUNTER — Other Ambulatory Visit: Payer: Self-pay

## 2023-01-06 ENCOUNTER — Inpatient Hospital Stay (HOSPITAL_COMMUNITY): Payer: Medicaid Other

## 2023-01-06 ENCOUNTER — Inpatient Hospital Stay (HOSPITAL_COMMUNITY)
Admission: EM | Admit: 2023-01-06 | Discharge: 2023-01-08 | DRG: 092 | Disposition: A | Discharge status: Home / Self Care | Payer: Medicaid Other | Attending: Student in an Organized Health Care Education/Training Program | Admitting: Student in an Organized Health Care Education/Training Program

## 2023-01-06 DIAGNOSIS — Z880 Allergy status to penicillin: Secondary | ICD-10-CM | POA: Diagnosis not present

## 2023-01-06 DIAGNOSIS — F17200 Nicotine dependence, unspecified, uncomplicated: Secondary | ICD-10-CM | POA: Diagnosis not present

## 2023-01-06 DIAGNOSIS — E876 Hypokalemia: Secondary | ICD-10-CM

## 2023-01-06 DIAGNOSIS — I639 Cerebral infarction, unspecified: Secondary | ICD-10-CM | POA: Diagnosis not present

## 2023-01-06 DIAGNOSIS — F1010 Alcohol abuse, uncomplicated: Secondary | ICD-10-CM | POA: Diagnosis not present

## 2023-01-06 DIAGNOSIS — E611 Iron deficiency: Secondary | ICD-10-CM | POA: Diagnosis present

## 2023-01-06 DIAGNOSIS — Z888 Allergy status to other drugs, medicaments and biological substances status: Secondary | ICD-10-CM

## 2023-01-06 DIAGNOSIS — Z7901 Long term (current) use of anticoagulants: Secondary | ICD-10-CM

## 2023-01-06 DIAGNOSIS — Y9302 Activity, running: Secondary | ICD-10-CM | POA: Diagnosis present

## 2023-01-06 DIAGNOSIS — S0993XA Unspecified injury of face, initial encounter: Secondary | ICD-10-CM | POA: Diagnosis not present

## 2023-01-06 DIAGNOSIS — E7211 Homocystinuria: Secondary | ICD-10-CM | POA: Diagnosis not present

## 2023-01-06 DIAGNOSIS — E785 Hyperlipidemia, unspecified: Secondary | ICD-10-CM | POA: Diagnosis present

## 2023-01-06 DIAGNOSIS — R930 Abnormal findings on diagnostic imaging of skull and head, not elsewhere classified: Secondary | ICD-10-CM | POA: Diagnosis not present

## 2023-01-06 DIAGNOSIS — F1990 Other psychoactive substance use, unspecified, uncomplicated: Secondary | ICD-10-CM

## 2023-01-06 DIAGNOSIS — S199XXA Unspecified injury of neck, initial encounter: Secondary | ICD-10-CM | POA: Diagnosis not present

## 2023-01-06 DIAGNOSIS — Z833 Family history of diabetes mellitus: Secondary | ICD-10-CM | POA: Diagnosis not present

## 2023-01-06 DIAGNOSIS — G932 Benign intracranial hypertension: Secondary | ICD-10-CM | POA: Diagnosis present

## 2023-01-06 DIAGNOSIS — F1410 Cocaine abuse, uncomplicated: Secondary | ICD-10-CM | POA: Diagnosis not present

## 2023-01-06 DIAGNOSIS — K219 Gastro-esophageal reflux disease without esophagitis: Secondary | ICD-10-CM | POA: Diagnosis present

## 2023-01-06 DIAGNOSIS — F1090 Alcohol use, unspecified, uncomplicated: Secondary | ICD-10-CM

## 2023-01-06 DIAGNOSIS — K029 Dental caries, unspecified: Secondary | ICD-10-CM | POA: Diagnosis not present

## 2023-01-06 DIAGNOSIS — F1290 Cannabis use, unspecified, uncomplicated: Secondary | ICD-10-CM | POA: Diagnosis present

## 2023-01-06 DIAGNOSIS — W010XXA Fall on same level from slipping, tripping and stumbling without subsequent striking against object, initial encounter: Secondary | ICD-10-CM | POA: Diagnosis present

## 2023-01-06 DIAGNOSIS — R9431 Abnormal electrocardiogram [ECG] [EKG]: Secondary | ICD-10-CM | POA: Diagnosis not present

## 2023-01-06 DIAGNOSIS — S0990XA Unspecified injury of head, initial encounter: Secondary | ICD-10-CM | POA: Diagnosis not present

## 2023-01-06 DIAGNOSIS — F17210 Nicotine dependence, cigarettes, uncomplicated: Secondary | ICD-10-CM | POA: Diagnosis present

## 2023-01-06 DIAGNOSIS — N920 Excessive and frequent menstruation with regular cycle: Secondary | ICD-10-CM | POA: Diagnosis present

## 2023-01-06 DIAGNOSIS — H547 Unspecified visual loss: Secondary | ICD-10-CM

## 2023-01-06 DIAGNOSIS — G08 Intracranial and intraspinal phlebitis and thrombophlebitis: Principal | ICD-10-CM

## 2023-01-06 DIAGNOSIS — E538 Deficiency of other specified B group vitamins: Secondary | ICD-10-CM | POA: Diagnosis not present

## 2023-01-06 DIAGNOSIS — F1490 Cocaine use, unspecified, uncomplicated: Secondary | ICD-10-CM | POA: Diagnosis present

## 2023-01-06 DIAGNOSIS — F1910 Other psychoactive substance abuse, uncomplicated: Secondary | ICD-10-CM

## 2023-01-06 DIAGNOSIS — Z8249 Family history of ischemic heart disease and other diseases of the circulatory system: Secondary | ICD-10-CM | POA: Diagnosis not present

## 2023-01-06 HISTORY — DX: Intracranial and intraspinal phlebitis and thrombophlebitis: G08

## 2023-01-06 HISTORY — DX: Nicotine dependence, unspecified, uncomplicated: F17.200

## 2023-01-06 HISTORY — DX: Cocaine abuse, uncomplicated: F14.10

## 2023-01-06 HISTORY — DX: Cannabis abuse, uncomplicated: F12.10

## 2023-01-06 HISTORY — DX: Opioid use, unspecified, uncomplicated: F11.90

## 2023-01-06 HISTORY — DX: Headache, unspecified: R51.9

## 2023-01-06 HISTORY — DX: Alcohol abuse, uncomplicated: F10.10

## 2023-01-06 HISTORY — DX: Other chronic pain: G89.29

## 2023-01-06 HISTORY — DX: Other psychoactive substance abuse, uncomplicated: F19.10

## 2023-01-06 LAB — CBC WITH DIFFERENTIAL/PLATELET
Abs Immature Granulocytes: 0.02 10*3/uL (ref 0.00–0.07)
Basophils Absolute: 0.1 10*3/uL (ref 0.0–0.1)
Basophils Relative: 1 %
Eosinophils Absolute: 0.2 10*3/uL (ref 0.0–0.5)
Eosinophils Relative: 2 %
HCT: 40.1 % (ref 36.0–46.0)
Hemoglobin: 13.5 g/dL (ref 12.0–15.0)
Immature Granulocytes: 0 %
Lymphocytes Relative: 26 %
Lymphs Abs: 2.6 10*3/uL (ref 0.7–4.0)
MCH: 34 pg (ref 26.0–34.0)
MCHC: 33.7 g/dL (ref 30.0–36.0)
MCV: 101 fL — ABNORMAL HIGH (ref 80.0–100.0)
Monocytes Absolute: 0.9 10*3/uL (ref 0.1–1.0)
Monocytes Relative: 9 %
Neutro Abs: 6.2 10*3/uL (ref 1.7–7.7)
Neutrophils Relative %: 62 %
Platelets: 280 10*3/uL (ref 150–400)
RBC: 3.97 MIL/uL (ref 3.87–5.11)
RDW: 15.4 % (ref 11.5–15.5)
WBC: 9.9 10*3/uL (ref 4.0–10.5)
nRBC: 0 % (ref 0.0–0.2)

## 2023-01-06 LAB — BASIC METABOLIC PANEL
Anion gap: 7 (ref 5–15)
BUN: 8 mg/dL (ref 6–20)
CO2: 27 mmol/L (ref 22–32)
Calcium: 8.6 mg/dL — ABNORMAL LOW (ref 8.9–10.3)
Chloride: 104 mmol/L (ref 98–111)
Creatinine, Ser: 0.53 mg/dL (ref 0.44–1.00)
GFR, Estimated: >60 mL/min (ref >60)
Glucose, Bld: 90 mg/dL (ref 70–99)
Potassium: 3.3 mmol/L — ABNORMAL LOW (ref 3.5–5.1)
Sodium: 138 mmol/L (ref 135–145)

## 2023-01-06 LAB — HEPARIN LEVEL (UNFRACTIONATED)
Heparin Unfractionated: 0.11 IU/mL — ABNORMAL LOW (ref 0.30–0.70)
Heparin Unfractionated: 0.17 IU/mL — ABNORMAL LOW (ref 0.30–0.70)

## 2023-01-06 LAB — RAPID URINE DRUG SCREEN, HOSP PERFORMED
Amphetamines: NOT DETECTED
Barbiturates: NOT DETECTED
Benzodiazepines: POSITIVE — AB
Cocaine: POSITIVE — AB
Opiates: POSITIVE — AB
Tetrahydrocannabinol: POSITIVE — AB

## 2023-01-06 LAB — HCG, QUANTITATIVE, PREGNANCY: hCG, Beta Chain, Quant, S: 1 m[IU]/mL (ref <5)

## 2023-01-06 LAB — ANTITHROMBIN III: AntiThromb III Func: 100 % (ref 75–120)

## 2023-01-06 LAB — MAGNESIUM: Magnesium: 1.9 mg/dL (ref 1.7–2.4)

## 2023-01-06 MED ORDER — ACETAMINOPHEN 325 MG PO TABS
650.0000 mg | ORAL_TABLET | Freq: Four times a day (QID) | ORAL | Status: DC | PRN
  Administered 2023-01-06 – 2023-01-08 (×5): 650 mg via ORAL
  Filled 2023-01-06 (×5): qty 2

## 2023-01-06 MED ORDER — ONDANSETRON HCL 4 MG/2ML IJ SOLN
4.0000 mg | Freq: Four times a day (QID) | INTRAMUSCULAR | Status: DC | PRN
  Administered 2023-01-07: 4 mg via INTRAVENOUS
  Filled 2023-01-06: qty 2

## 2023-01-06 MED ORDER — ACETAMINOPHEN 650 MG RE SUPP
650.0000 mg | Freq: Four times a day (QID) | RECTAL | Status: DC | PRN
  Filled 2023-01-06: qty 1

## 2023-01-06 MED ORDER — HEPARIN (PORCINE) 25000 UT/250ML-% IV SOLN
1350.0000 [IU]/h | INTRAVENOUS | Status: AC
  Administered 2023-01-06: 1000 [IU]/h via INTRAVENOUS
  Administered 2023-01-07: 1400 [IU]/h via INTRAVENOUS
  Filled 2023-01-06 (×2): qty 250

## 2023-01-06 MED ORDER — STROKE: EARLY STAGES OF RECOVERY BOOK
Freq: Once | Status: AC
  Filled 2023-01-06: qty 1

## 2023-01-06 MED ORDER — ONDANSETRON HCL 4 MG PO TABS: 4.0000 mg | ORAL_TABLET | Freq: Four times a day (QID) | ORAL | Status: DC | PRN

## 2023-01-06 MED ORDER — HYDROMORPHONE HCL 1 MG/ML IJ SOLN
1.0000 mg | Freq: Once | INTRAMUSCULAR | Status: AC
  Administered 2023-01-06: 1 mg via INTRAVENOUS

## 2023-01-06 MED ORDER — IOHEXOL 350 MG/ML SOLN
75.0000 mL | Freq: Once | INTRAVENOUS | Status: AC | PRN
  Administered 2023-01-06: 75 mL via INTRAVENOUS

## 2023-01-06 MED ORDER — DEXAMETHASONE SODIUM PHOSPHATE 10 MG/ML IJ SOLN
10.0000 mg | Freq: Once | INTRAMUSCULAR | Status: AC
  Administered 2023-01-06: 10 mg via INTRAVENOUS
  Filled 2023-01-06: qty 1

## 2023-01-06 MED ORDER — SENNOSIDES-DOCUSATE SODIUM 8.6-50 MG PO TABS: 1.0000 | ORAL_TABLET | Freq: Every evening | ORAL | Status: DC | PRN

## 2023-01-06 MED ORDER — HYDROMORPHONE HCL 1 MG/ML IJ SOLN
1.0000 mg | Freq: Once | INTRAMUSCULAR | Status: DC
  Filled 2023-01-06: qty 1

## 2023-01-06 MED ORDER — STROKE: EARLY STAGES OF RECOVERY BOOK: Freq: Once | Status: DC

## 2023-01-06 MED ORDER — HEPARIN BOLUS VIA INFUSION
4000.0000 [IU] | Freq: Once | INTRAVENOUS | Status: AC
  Administered 2023-01-06: 4000 [IU] via INTRAVENOUS
  Filled 2023-01-06: qty 4000

## 2023-01-06 MED ORDER — NICOTINE 14 MG/24HR TD PT24
14.0000 mg | MEDICATED_PATCH | Freq: Every day | TRANSDERMAL | Status: DC
  Administered 2023-01-06 – 2023-01-08 (×3): 14 mg via TRANSDERMAL
  Filled 2023-01-06 (×3): qty 1

## 2023-01-06 MED ORDER — METOCLOPRAMIDE HCL 5 MG/ML IJ SOLN
10.0000 mg | Freq: Once | INTRAMUSCULAR | Status: AC
  Administered 2023-01-06: 10 mg via INTRAVENOUS
  Filled 2023-01-06: qty 2

## 2023-01-06 MED ORDER — METOCLOPRAMIDE HCL 5 MG/ML IJ SOLN
10.0000 mg | Freq: Once | INTRAMUSCULAR | Status: DC
  Filled 2023-01-06: qty 2

## 2023-01-06 MED ORDER — POTASSIUM CHLORIDE CRYS ER 20 MEQ PO TBCR
40.0000 meq | EXTENDED_RELEASE_TABLET | Freq: Two times a day (BID) | ORAL | Status: AC
  Administered 2023-01-06 – 2023-01-07 (×2): 40 meq via ORAL
  Filled 2023-01-06 (×2): qty 2

## 2023-01-06 MED ORDER — DIPHENHYDRAMINE HCL 50 MG/ML IJ SOLN
25.0000 mg | Freq: Once | INTRAMUSCULAR | Status: AC
  Administered 2023-01-06: 25 mg via INTRAVENOUS
  Filled 2023-01-06: qty 1

## 2023-01-06 NOTE — Progress Notes (Addendum)
STROKE TEAM PROGRESS NOTE   INTERVAL HISTORY Her family is at the bedside.  She is laying in bed in no apparent distress she complains of pain between her left ear.  She is on a heparin drip She presents for a persistent headache since hitting her head on the car when she tripped last week.  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  She denies any birth control use  [place] LE negative for DVT.  UDS positive for opiates, benzos, cocaine, and THC She does smoke 1 pack/day and is requesting a nicotine patch She also endorses drinking a couple of beers a day We will also send hypercoagulable panel and an ANA  Vitals:   01/06/23 1424 01/06/23 1430 01/06/23 1440 01/06/23 1602  BP: (!) 94/56 100/69 103/76   Pulse: 88 83 91   Resp:  (!) 22 (!) 23   Temp:    97.8 F (36.6 C)  TempSrc:      SpO2: 99% 100% 99%   Weight:      Height:       CBC:  Recent Labs  Lab 01/06/23 0445  WBC 9.9  NEUTROABS 6.2  HGB 13.5  HCT 40.1  MCV 101.0*  PLT 280   Basic Metabolic Panel:  Recent Labs  Lab 01/06/23 0445 01/06/23 0542  NA 138  --   K 3.3*  --   CL 104  --   CO2 27  --   GLUCOSE 90  --   BUN 8  --   CREATININE 0.53  --   CALCIUM 8.6*  --   MG  --  1.9   Lipid Panel: No results for input(s): "CHOL", "TRIG", "HDL", "CHOLHDL", "VLDL", "LDLCALC" in the last 168 hours. HgbA1c: No results for input(s): "HGBA1C" in the last 168 hours. Urine Drug Screen:  Recent Labs  Lab 01/06/23 0855  LABOPIA POSITIVE*  COCAINSCRNUR POSITIVE*  LABBENZ POSITIVE*  AMPHETMU NONE DETECTED  THCU POSITIVE*  LABBARB NONE DETECTED    Alcohol Level No results for input(s): "ETH" in the last 168 hours.  IMAGING past 24 hours VAS [place] LOWER EXTREMITY VENOUS (DVT)  Result Date: 01/06/2023  Lower Venous DVT Study Patient Name:  [name]  Date of Exam:   01/06/2023 Medical Rec #: [id number]            Accession #:    [id number] Date of Birth: [birth year]           Patient Gender: F Patient Age:   32 years Exam Location:  [facility] Procedure:      VAS [place] LOWER EXTREMITY VENOUS (DVT) Referring Phys: Angelique Blonder WOLFE --------------------------------------------------------------------------------  Other Indications: Dural venous sinus thrombosis. Comparison Study: No prior studies. Performing Technologist: Jean Rosenthal RDMS, RVT  Examination Guidelines: A complete evaluation includes B-mode imaging, spectral Doppler, color Doppler, and power Doppler as needed of all accessible portions of each vessel. Bilateral testing is considered an integral part of a complete examination. Limited examinations for reoccurring indications may be performed as noted. The reflux portion of the exam is performed with the patient in reverse Trendelenburg.  +---------+---------------+---------+-----------+----------+--------------+ RIGHT    CompressibilityPhasicitySpontaneityPropertiesThrombus Aging +---------+---------------+---------+-----------+----------+--------------+ CFV      Full           Yes      Yes                                 +---------+---------------+---------+-----------+----------+--------------+  SFJ      Full                                                        +---------+---------------+---------+-----------+----------+--------------+ FV Prox  Full                                                        +---------+---------------+---------+-----------+----------+--------------+ FV Mid   Full           Yes      Yes                                 +---------+---------------+---------+-----------+----------+--------------+ FV DistalFull                                                        +---------+---------------+---------+-----------+----------+--------------+ PFV      Full                                                         +---------+---------------+---------+-----------+----------+--------------+ POP      Full           Yes      Yes                                 +---------+---------------+---------+-----------+----------+--------------+ PTV      Full                                                        +---------+---------------+---------+-----------+----------+--------------+ PERO     Full                                                        +---------+---------------+---------+-----------+----------+--------------+   +---------+---------------+---------+-----------+----------+--------------+ LEFT     CompressibilityPhasicitySpontaneityPropertiesThrombus Aging +---------+---------------+---------+-----------+----------+--------------+ CFV      Full           Yes      Yes                                 +---------+---------------+---------+-----------+----------+--------------+ SFJ      Full                                                        +---------+---------------+---------+-----------+----------+--------------+  FV Prox  Full                                                        +---------+---------------+---------+-----------+----------+--------------+ FV Mid   Full           Yes      Yes                                 +---------+---------------+---------+-----------+----------+--------------+ FV DistalFull                                                        +---------+---------------+---------+-----------+----------+--------------+ PFV      Full                                                        +---------+---------------+---------+-----------+----------+--------------+ POP      Full           Yes      Yes                                 +---------+---------------+---------+-----------+----------+--------------+ PTV      Full                                                         +---------+---------------+---------+-----------+----------+--------------+ PERO     Full                                                        +---------+---------------+---------+-----------+----------+--------------+     Summary: RIGHT: - There is no evidence of deep vein thrombosis in the lower extremity.  - No cystic structure found in the popliteal fossa.  LEFT: - There is no evidence of deep vein thrombosis in the lower extremity.  - No cystic structure found in the popliteal fossa.  *See table(s) above for measurements and observations.    Preliminary    VAS US CAROTID  Result Date: 01/06/2023 Carotid Arterial Duplex Study Patient Name:  [name]  Date of Exam:   01/06/2023 Medical Rec #: [id number]           Accession #:    [id number] Date of Birth: 1990/11/07           Patient Gender: F Patient Age:   4 years Exam Location:  [facility] Procedure:      VAS US CAROTID Referring Phys: Erlinda Hong --------------------------------------------------------------------------------  Indications:       CVA and Headaches for months and left facial tingling for the  last few days. Risk Factors:      Current smoker. Comparison Study:  No priors. Performing Technologist: Marilynne Halsted RDMS, RVT  Examination Guidelines: A complete evaluation includes B-mode imaging, spectral Doppler, color Doppler, and power Doppler as needed of all accessible portions of each vessel. Bilateral testing is considered an integral part of a complete examination. Limited examinations for reoccurring indications may be performed as noted.  Right Carotid Findings: +----------+--------+--------+--------+------------------+------------------+           PSV cm/sEDV cm/sStenosisPlaque DescriptionComments           +----------+--------+--------+--------+------------------+------------------+ CCA Prox  114     22                                                    +----------+--------+--------+--------+------------------+------------------+ CCA Distal89      22                                intimal thickening +----------+--------+--------+--------+------------------+------------------+ ICA Prox  79      27                                intimal thickening +----------+--------+--------+--------+------------------+------------------+ ICA Distal75      31                                                   +----------+--------+--------+--------+------------------+------------------+ ECA       78      19                                                   +----------+--------+--------+--------+------------------+------------------+ +----------+--------+-------+----------------+-------------------+           PSV cm/sEDV cmsDescribe        Arm Pressure (mmHG) +----------+--------+-------+----------------+-------------------+ [id number]            Multiphasic, WNL                    +----------+--------+-------+----------------+-------------------+ +---------+--------+--+--------+--+---------+ VertebralPSV cm/s58EDV cm/s21Antegrade +---------+--------+--+--------+--+---------+  Left Carotid Findings: +----------+--------+--------+--------+------------------+------------------+           PSV cm/sEDV cm/sStenosisPlaque DescriptionComments           +----------+--------+--------+--------+------------------+------------------+ CCA Prox  91      16                                                   +----------+--------+--------+--------+------------------+------------------+ CCA Distal79      23                                intimal thickening +----------+--------+--------+--------+------------------+------------------+ ICA Prox  82      32  intimal thickening +----------+--------+--------+--------+------------------+------------------+ ICA Distal105     42                                                    +----------+--------+--------+--------+------------------+------------------+ ECA       70      16                                                   +----------+--------+--------+--------+------------------+------------------+ +----------+--------+--------+----------------+-------------------+           PSV cm/sEDV cm/sDescribe        Arm Pressure (mmHG) +----------+--------+--------+----------------+-------------------+ [id number]             Multiphasic, WNL                    +----------+--------+--------+----------------+-------------------+ +---------+--------+--+--------+--+---------+ VertebralPSV cm/s51EDV cm/s16Antegrade +---------+--------+--+--------+--+---------+   Summary: Right Carotid: The extracranial vessels were near-normal with only minimal wall                thickening or plaque. Left Carotid: The extracranial vessels were near-normal with only minimal wall               thickening or plaque. Vertebrals:  Bilateral vertebral arteries demonstrate antegrade flow. Subclavians: Normal flow hemodynamics were seen in bilateral subclavian              arteries. *See table(s) above for measurements and observations.  Electronically signed by Delia Heady MD on 01/06/2023 at 12:40:06 PM.    Final    CT VENOGRAM HEAD  Result Date: 01/06/2023 CLINICAL DATA:  Abnormal head CT. EXAM: CT VENOGRAM HEAD TECHNIQUE: Venographic phase images of the brain were obtained following the administration of intravenous contrast. Multiplanar reformats and maximum intensity projections were generated. RADIATION DOSE REDUCTION: This exam was performed according to the departmental dose-optimization program which includes automated exposure control, adjustment of the mA and/or kV according to patient size and/or use of iterative reconstruction technique. CONTRAST:  75mL OMNIPAQUE IOHEXOL 350 MG/ML SOLN COMPARISON:  Head CT from earlier today FINDINGS: Discrete  occlusive clot with non filling extending from the torcula throughout the left transverse and sigmoid sinuses into the upper left IJ. The central and cortical veins are symmetrically enhancing. No evidence of brain edema or intracranial hemorrhage. Major arterial vessels are enhancing. 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 No brain edema or hemorrhage. Electronically Signed   By: Tiburcio Pea M.D.   On: 01/06/2023 06:03   CT Head Wo Contrast  Result Date: 01/06/2023 CLINICAL DATA:  Neck trauma with dangerous injury mechanism. EXAM: CT HEAD WITHOUT CONTRAST CT MAXILLOFACIAL WITHOUT CONTRAST CT CERVICAL SPINE WITHOUT CONTRAST TECHNIQUE: Multidetector CT imaging of the head, cervical spine, and maxillofacial structures were performed using the standard protocol without intravenous contrast. Multiplanar CT image reconstructions of the cervical spine and maxillofacial structures were also generated. RADIATION DOSE REDUCTION: This exam was performed according to the departmental dose-optimization program which includes automated exposure control, adjustment of the mA and/or kV according to patient size and/or use of iterative reconstruction technique. COMPARISON:  Head CT 07/26/2022 FINDINGS: CT HEAD FINDINGS Brain: No evidence of  acute infarction, hemorrhage, hydrocephalus, extra-axial collection or mass lesion/mass effect. Vascular: High-density appearance of the left transverse and sigmoid sinus also involving a probable cortical vein at the tentorium. Skull: Negative for fracture. CT MAXILLOFACIAL FINDINGS Osseous: Negative for mandibular fracture or dislocation. Advanced dental caries with multiple periapical erosions. Orbits: No evidence of injury Sinuses: No evidence of injury.  Anterior nasal septal perforation. Soft tissues: No evidence of injury CT CERVICAL SPINE FINDINGS Alignment: No  traumatic malalignment Skull base and vertebrae: No acute fracture Soft tissues and spinal canal: No prevertebral fluid or swelling. No visible canal hematoma. Disc levels:  No evidence of cervical spine degeneration Upper chest: No evidence of injury IMPRESSION: 1. No evidence of intracranial or cervical spine injury. Negative for facial fracture. 2. High-density appearance at the left transverse and sigmoid dural sinuses, recommend CT venogram to evaluate for dural venous sinus thrombosis. Electronically Signed   By: Tiburcio Pea M.D.   On: 01/06/2023 04:27   CT Maxillofacial Wo Contrast  Result Date: 01/06/2023 CLINICAL DATA:  Neck trauma with dangerous injury mechanism. EXAM: CT HEAD WITHOUT CONTRAST CT MAXILLOFACIAL WITHOUT CONTRAST CT CERVICAL SPINE WITHOUT CONTRAST TECHNIQUE: Multidetector CT imaging of the head, cervical spine, and maxillofacial structures were performed using the standard protocol without intravenous contrast. Multiplanar CT image reconstructions of the cervical spine and maxillofacial structures were also generated. RADIATION DOSE REDUCTION: This exam was performed according to the departmental dose-optimization program which includes automated exposure control, adjustment of the mA and/or kV according to patient size and/or use of iterative reconstruction technique. COMPARISON:  Head CT 07/26/2022 FINDINGS: CT HEAD FINDINGS Brain: No evidence of acute infarction, hemorrhage, hydrocephalus, extra-axial collection or mass lesion/mass effect. Vascular: High-density appearance of the left transverse and sigmoid sinus also involving a probable cortical vein at the tentorium. Skull: Negative for fracture. CT MAXILLOFACIAL FINDINGS Osseous: Negative for mandibular fracture or dislocation. Advanced dental caries with multiple periapical erosions. Orbits: No evidence of injury Sinuses: No evidence of injury.  Anterior nasal septal perforation. Soft tissues: No evidence of injury CT CERVICAL  SPINE FINDINGS Alignment: No traumatic malalignment Skull base and vertebrae: No acute fracture Soft tissues and spinal canal: No prevertebral fluid or swelling. No visible canal hematoma. Disc levels:  No evidence of cervical spine degeneration Upper chest: No evidence of injury IMPRESSION: 1. No evidence of intracranial or cervical spine injury. Negative for facial fracture. 2. High-density appearance at the left transverse and sigmoid dural sinuses, recommend CT venogram to evaluate for dural venous sinus thrombosis. Electronically Signed   By: Tiburcio Pea M.D.   On: 01/06/2023 04:27   CT Cervical Spine Wo Contrast  Result Date: 01/06/2023 CLINICAL DATA:  Neck trauma with dangerous injury mechanism. EXAM: CT HEAD WITHOUT CONTRAST CT MAXILLOFACIAL WITHOUT CONTRAST CT CERVICAL SPINE WITHOUT CONTRAST TECHNIQUE: Multidetector CT imaging of the head, cervical spine, and maxillofacial structures were performed using the standard protocol without intravenous contrast. Multiplanar CT image reconstructions of the cervical spine and maxillofacial structures were also generated. RADIATION DOSE REDUCTION: This exam was performed according to the departmental dose-optimization program which includes automated exposure control, adjustment of the mA and/or kV according to patient size and/or use of iterative reconstruction technique. COMPARISON:  Head CT 07/26/2022 FINDINGS: CT HEAD FINDINGS Brain: No evidence of acute infarction, hemorrhage, hydrocephalus, extra-axial collection or mass lesion/mass effect. Vascular: High-density appearance of the left transverse and sigmoid sinus also involving a probable cortical vein at the tentorium. Skull: Negative for fracture. CT MAXILLOFACIAL FINDINGS Osseous: Negative for  mandibular fracture or dislocation. Advanced dental caries with multiple periapical erosions. Orbits: No evidence of injury Sinuses: No evidence of injury.  Anterior nasal septal perforation. Soft tissues: No  evidence of injury CT CERVICAL SPINE FINDINGS Alignment: No traumatic malalignment Skull base and vertebrae: No acute fracture Soft tissues and spinal canal: No prevertebral fluid or swelling. No visible canal hematoma. Disc levels:  No evidence of cervical spine degeneration Upper chest: No evidence of injury IMPRESSION: 1. No evidence of intracranial or cervical spine injury. Negative for facial fracture. 2. High-density appearance at the left transverse and sigmoid dural sinuses, recommend CT venogram to evaluate for dural venous sinus thrombosis. Electronically Signed   By: Tiburcio Pea M.D.   On: 01/06/2023 04:27    PHYSICAL EXAM  Temp:  [97.8 F (36.6 C)-98.3 F (36.8 C)] 97.8 F (36.6 C) (07/08 1602) Pulse Rate:  [70-91] 91 (07/08 1440) Resp:  [15-23] 23 (07/08 1440) BP: (94-120)/(56-81) 103/76 (07/08 1440) SpO2:  [96 %-100 %] 99 % (07/08 1440) Weight:  [57.2 kg] 57.2 kg (07/08 0218)  General - Well nourished, well developed, in no apparent distress. Cardiovascular - Regular rhythm and rate.  Mental Status -  Level of arousal and orientation to time, place, and person were intact. Language including expression, naming, repetition, comprehension was assessed and found intact. Attention span and concentration were normal. Recent and remote memory were intact. Fund of Knowledge was assessed and was intact.  Cranial Nerves II - XII - II - Visual field intact OU. III, IV, VI - Extraocular movements intact. V -crease sensation on left face VII - Facial movement intact bilaterally. VIII - Hearing & vestibular intact bilaterally. X - Palate elevates symmetrically. XI - Chin turning & shoulder shrug intact bilaterally. XII - Tongue protrusion intact.  Motor Strength - The patient's strength was normal in all extremities and pronator drift was absent.  Bulk was normal and fasciculations were absent.   Motor Tone - Muscle tone was assessed at the neck and appendages and was  normal. Sensory - Light touch, temperature/pinprick were assessed and were symmetrical.    Coordination - The patient had normal movements in the hands and feet with no ataxia or dysmetria.  Tremor was absent.  Gait and Station - deferred.  ASSESSMENT/PLAN [name] is a 32 y.o. 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 No brain edema or hemorrhage   Dural venous sinus thrombosis of left transverse and sigmoid Etiology: Likely polysubstance abuse CT head High-density appearance at the left transverse and sigmoid dural sinuses CTV-Occlusive dural venous sinus thrombosis extending from the torcula to the left IJ and including the entire left transverse and sigmoid sinuses. No brain edema or hemorrhage. MRI pending Carotid Doppler negative 2D Echo pending [place] LE negative for DVT UDS positive for opiates, cocaine, benzos, THC Hypercoagulable panel pending ANA pending LDL pending HgbA1c pending VTE prophylaxis -heparin IV No antithrombotics prior to admission, now on heparin IV.  In 2 to 3 days consider switching to Eliquis Therapy recommendations: Pending Disposition: Pending  BP management Home meds: None Stable on the low side Long-term BP goal normotensive  Hyperlipidemia Home meds: None LDL pending, goal < 70 Will consider statin if needed  Cocaine use  THC Use  Alcohol abuse UDS  positive for opiates, cocaine, benzos, THC advised to drink no more than 1 drink(s) a day Cessation advised on substance abuse   Tobacco abuse  Cigarette smoker advised to stop smoking Nicotine patch  Not using birth control pills per pt Cessation education   Hospital day # 0  Gevena Mart DNP, ACNPC-AG   [facility]  ATTENDING NOTE: I reviewed above note and agree with the assessment and plan. Pt was seen and examined.   Family at the bedside. Pt complaining of left temporal, behind ear and back of head pain, likely due to left sided CVST with venous congestion. Pt also felt abnormal feeling of left face probably also related to venous congestion. No other neuro deficit. Hypercoagulable work up pending, MRI pending. LDL and A1C pending. Etiology for CVST although not quite clear, but could be related to polysubstance abuse. Cessation education provided. Now on heparin IV, will switch to eliquis in a couple of days. Medication compliance education provided. On nicotine patch. Will follow  For detailed assessment and plan, please refer to above/below as I have made changes wherever appropriate.   Marvel Plan, MD PhD Stroke Neurology 01/06/2023 10:31 PM  I spent additional 30 inpt minutes of face-to-face time with the patient, more than 50% of which was spent in counseling and coordination of care, reviewing test results, images and medication, and discussing the diagnosis, treatment plan and potential prognosis. This patient's care requiresreview of multiple databases, neurological assessment, discussion with family, other specialists and medical decision making of high complexity.       To contact Stroke Continuity provider, please refer to [email or web address]. After hours, contact General Neurology

## 2023-01-06 NOTE — Consult Note (Addendum)
NEUROLOGY CONSULTATION NOTE   Date of service: January 06, 2023 Patient Name: [name] MRN:  [id number] DOB:  1991/03/06 Reason for consult: "dural venous sinus thrombosis." Requesting Provider: Marily Memos, MD _ _ _   _ __   _ __ _ _  __ __   _ __   __ _  History of Present Illness  [name] is a 32 y.o. female with PMH significant for concussion who had a fall and hit the left side of her head. Feels a soft spot on the left side of her head. This was on Friday, she had alcohol and ws celebrating her anniversary. Was seen at [facility] yesterday for this and had a headache and discharged. 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 No brain edema or hemorrhage.  She smokes, drinks 3-4 beers a day, uses marijuana. She reports headaches for months. Reports L facial tingling for the last few days.    ROS   Constitutional Denies weight loss, fever and chills.   HEENT Denies changes in vision and hearing.   Respiratory Denies SOB and cough.   CV Denies palpitations and CP   GI Denies abdominal pain, nausea, vomiting and diarrhea.   GU Denies dysuria and urinary frequency.   MSK Denies myalgia and joint pain.   Skin Denies rash and pruritus.   Neurological + mild headache but no syncope.   Psychiatric Denies recent changes in mood. Denies anxiety and depression.    Past History   Past Medical History:  Diagnosis Date   Concussion    Past Surgical History:  Procedure Laterality Date   APPENDECTOMY     Family History  Problem Relation Age of Onset   Diabetes Mother    Hypertension Mother    Social History   Socioeconomic History   Marital status: Single    Spouse name: Not on file   Number of children: Not on file   Years of education: Not on file   Highest education level: Not  on file  Occupational History   Not on file  Tobacco Use   Smoking status: Every Day    Packs/day: .5    Types: Cigarettes   Smokeless tobacco: Never  Vaping Use   Vaping Use: Never used  Substance and Sexual Activity   Alcohol use: Yes    Comment: occ   Drug use: Not Currently    Types: Cocaine    Comment: last used 06/02/18   Sexual activity: Yes    Birth control/protection: None  Other Topics Concern   Not on file  Social History Narrative   Not on file   Social Determinants of Health   Financial Resource Strain: Not on file  Food Insecurity: Not on file  Transportation Needs: Not on file  Physical Activity: Not on file  Stress: Not on file  Social Connections: Not on file   Allergies  Allergen Reactions   Gentamicin Rash    Unknown reaction   Penicillins Rash    Medications  (Not in a hospital admission)    Vitals   Vitals:   01/06/23 0218 01/06/23 0257 01/06/23 0445 01/06/23 0611  BP:  111/80 112/73 120/78  Pulse:  72 76 70  Resp:  18    Temp:  98.3 F (36.8 C)    TempSrc:  Oral  SpO2:  100% 98% 96%  Weight: 57.2 kg     Height: 5\' 3"  (1.6 m)        Body mass index is 22.32 kg/m.  Physical Exam   General: Laying comfortably in bed; in no acute distress.  HENT: Normal oropharynx and mucosa. Normal external appearance of ears and nose. Poor dentition. Neck: Supple, no pain or tenderness  CV: No JVD. No peripheral edema.  Pulmonary: Symmetric Chest rise. Normal respiratory effort.  Abdomen: Soft to touch, non-tender.  Ext: No cyanosis, edema, or deformity  Skin: No rash. Normal palpation of skin.   Musculoskeletal: Normal digits and nails by inspection. No clubbing.   Neurologic Examination  Mental status/Cognition: Alert, oriented to self, place, month and year, good attention.  Speech/language: Fluent, comprehension intact, object naming intact, repetition intact.  Cranial nerves:   CN II Pupils equal and reactive to light, no VF  deficits    CN III,IV,VI EOM intact, no gaze preference or deviation, no nystagmus    CN V normal sensation in V1, V2, and V3 segments bilaterally    CN VII no asymmetry, no nasolabial fold flattening    CN VIII normal hearing to speech    CN IX & X normal palatal elevation, no uvular deviation    CN XI 5/5 head turn and 5/5 shoulder shrug bilaterally    CN XII midline tongue protrusion    Motor:  Muscle bulk: normal, tone intact thourhgout, pronator drift intact BL tremor intact bL Mvmt Root Nerve  Muscle Right Left Comments  SA C5/6 Ax Deltoid 5 5   EF C5/6 Mc Biceps 5 5   EE C6/7/8 Rad Triceps 5 5   WF C6/7 Med FCR     WE C7/8 PIN ECU     F Ab C8/T1 U ADM/FDI 5 5   HF L1/2/3 Fem Illopsoas 5 5   KE L2/3/4 Fem Quad 5 5   DF L4/5 D Peron Tib Ant 5 5   PF S1/2 Tibial Grc/Sol 5 5    Sensation:  Light touch Intact throughout   Pin prick    Temperature    Vibration   Proprioception    Coordination/Complex Motor:  - Finger to Nose intact BL - Heel to shin intact BL - Rapid alternating movement are normal - Gait: deferred.  Labs   CBC:  Recent Labs  Lab 01/06/23 0445  WBC 9.9  NEUTROABS 6.2  HGB 13.5  HCT 40.1  MCV 101.0*  PLT 280    Basic Metabolic Panel:  Lab Results  Component Value Date   NA 138 01/06/2023   K 3.3 (L) 01/06/2023   CO2 27 01/06/2023   GLUCOSE 90 01/06/2023   BUN 8 01/06/2023   CREATININE 0.53 01/06/2023   CALCIUM 8.6 (L) 01/06/2023   GFRNONAA >60 01/06/2023   GFRAA >60 01/27/2020   Lipid Panel: No results found for: "LDLCALC" HgbA1c: No results found for: "HGBA1C" Urine Drug Screen:     Component Value Date/Time   LABOPIA NONE DETECTED 06/02/2018 0220   COCAINSCRNUR POSITIVE (A) 06/02/2018 0220   LABBENZ NONE DETECTED 06/02/2018 0220   AMPHETMU NONE DETECTED 06/02/2018 0220   THCU POSITIVE (A) 06/02/2018 0220   LABBARB NONE DETECTED 06/02/2018 0220    Alcohol Level     Component Value Date/Time   ETH 70 (H) 06/02/2018 0220     CT Head without contrast(Personally reviewed): 1. No evidence of intracranial or cervical spine injury. Negative for facial fracture. 2. High-density appearance at  the left transverse and sigmoid dural sinuses, recommend CT venogram to evaluate for dural venous sinus thrombosis.  CT Venogram head(Personally reviewed): Occlusive dural venous sinus thrombosis extending from the torcula to the left IJ and including the entire left transverse and sigmoid sinuses. No brain edema or hemorrhage.  MRI Brain: pending Impression   EDONA RINEY is a 32 y.o. female who presents with left face tingling and left sigmoid and transverse sinus occlusive dural venous sinus thrombosis. Not on birth control, reports mild ear pain on the left but no ear fullness, no ear pressure, no tinnitis. She does have significantly poor dentition.  CT head and CT face with no obvious middle ear pathology or fluid collection.  Recommendations  - MRI Brain without contrast. - heparin gtt for 1-2 days and if no hemorrhage, then switch to Eliquis 5mg  BID - avoid birth control, vit A derivaties(retinoic acid). ______________________________________________________________________   Thank you for the opportunity to take part in the care of this patient. If you have any further questions, please contact the neurology consultation attending.  Signed,  Erick Blinks [facility] _ _ _   _ __   _ __ _ _  __ __   _ __   __ _

## 2023-01-06 NOTE — ED Notes (Signed)
Patient transported to CT 

## 2023-01-06 NOTE — Progress Notes (Signed)
ANTICOAGULATION CONSULT NOTE - Initial Consult  Pharmacy Consult for heparin Indication: sinus thrombosis   Allergies  Allergen Reactions   Gentamicin Rash    Unknown reaction   Penicillins Rash    Patient Measurements: Height: 5\' 3"  (160 cm) Weight: 57.2 kg (126 lb) IBW/kg (Calculated) : 52.4 Heparin Dosing Weight: 57.2 Kg  Vital Signs: Temp: 98.3 F (36.8 C) (07/08 0257) Temp Source: Oral (07/08 0257) BP: 120/78 (07/08 0611) Pulse Rate: 70 (07/08 0611)  Labs: Recent Labs    01/06/23 0445  HGB 13.5  HCT 40.1  PLT 280  CREATININE 0.53    Estimated Creatinine Clearance: 83.5 mL/min (by C-G formula based on SCr of 0.53 mg/dL).   Medical History: Past Medical History:  Diagnosis Date   Concussion      Assessment: 32yoF with no significant PMH found to have sinus thrombosis. The patient does not endorser taking oral anticoagulation prior to admission. CBC WNL. Pharmacy consulted to dose heparin infusion.    Goal of Therapy:  Heparin level 0.3-0.7 units/ml Monitor platelets by anticoagulation protocol: Yes   Plan:  Give 4000 units bolus x 1 Start heparin infusion at 1000 units/hr Check anti-Xa level in 6 hours and daily while on heparin Continue to monitor H&H and platelets  Ruben Im, PharmD Clinical Pharmacist 01/06/2023 6:23 AM Please check AMION for all [facility] numbers

## 2023-01-06 NOTE — ED Notes (Signed)
ED TO INPATIENT HANDOFF REPORT  ED Nurse Name and Phone #: Marcelino Duster [phone]  S Name/Age/Gender [name] 32 y.o. female Room/Bed: 046C/046C  Code Status   Code Status: Full Code  Home/SNF/Other Home Patient oriented to: self, place, time, and situation Is this baseline? Yes   Triage Complete: Triage complete  Chief Complaint Dural venous sinus thrombosis [G08]  Triage Note Pt arrived from home with mother. Reports hitting her head on her vehicle Friday. Was seen at [facility] last night for this. Said the top of head, down the left side of face is throbbing and hurting. Nauseous, dizzy and a headache since.   Allergies Allergies  Allergen Reactions   Gentamicin Rash   Penicillins Rash    Level of Care/Admitting Diagnosis ED Disposition     ED Disposition  Admit   Condition  --   Comment  Hospital Area: [facility] [[id number]]  Level of Care: Med-Surg [16]  May admit patient to [facility] or [facility] if equivalent level of care is available:: Yes  Covid Evaluation: Asymptomatic - no recent exposure (last 10 days) testing not required  Diagnosis: Dural venous sinus thrombosis [454098]  Admitting Physician: Tyson Alias [[id number]]  Attending Physician: Tyson Alias [phone]  Certification:: I certify this patient will need inpatient services for at least 2 midnights  Estimated Length of Stay: 2          B Medical/Surgery History Past Medical History:  Diagnosis Date   Concussion    Past Surgical History:  Procedure Laterality Date   APPENDECTOMY       A IV Location/Drains/Wounds Patient Lines/Drains/Airways Status     Active Line/Drains/Airways     Name Placement date Placement time Site Days   Peripheral IV 01/06/23 20 G Anterior;Proximal;Left Forearm 01/06/23  0447  Forearm  less than 1   Peripheral IV 01/06/23 20 G Right Antecubital 01/06/23  1013  Antecubital  less than 1             Intake/Output Last 24 hours No intake or output data in the 24 hours ending 01/06/23 1758  Labs/Imaging Results for orders placed or performed during the hospital encounter of 01/06/23 (from the past 48 hour(s))  CBC with Differential     Status: Abnormal   Collection Time: 01/06/23  4:45 AM  Result Value Ref Range   WBC 9.9 4.0 - 10.5 K/uL   RBC 3.97 3.87 - 5.11 MIL/uL   Hemoglobin 13.5 12.0 - 15.0 g/dL   HCT 08.6 57.8 - 46.9 %   MCV 101.0 (H) 80.0 - 100.0 fL   MCH 34.0 26.0 - 34.0 pg   MCHC 33.7 30.0 - 36.0 g/dL   RDW 62.9 52.8 - 41.3 %   Platelets 280 150 - 400 K/uL   nRBC 0.0 0.0 - 0.2 %   Neutrophils Relative % 62 %   Neutro Abs 6.2 1.7 - 7.7 K/uL   Lymphocytes Relative 26 %   Lymphs Abs 2.6 0.7 - 4.0 K/uL   Monocytes Relative 9 %   Monocytes Absolute 0.9 0.1 - 1.0 K/uL   Eosinophils Relative 2 %   Eosinophils Absolute 0.2 0.0 - 0.5 K/uL   Basophils Relative 1 %   Basophils Absolute 0.1 0.0 - 0.1 K/uL   Immature Granulocytes 0 %   Abs Immature Granulocytes 0.02 0.00 - 0.07 K/uL    Comment: Performed at [facility], [address] [place], [place] [address]  Basic metabolic  panel     Status: Abnormal   Collection Time: 01/06/23  4:45 AM  Result Value Ref Range   Sodium 138 135 - 145 mmol/L   Potassium 3.3 (L) 3.5 - 5.1 mmol/L   Chloride 104 98 - 111 mmol/L   CO2 27 22 - 32 mmol/L   Glucose, Bld 90 70 - 99 mg/dL    Comment: Glucose reference range applies only to samples taken after fasting for at least 8 hours.   BUN 8 6 - 20 mg/dL   Creatinine, Ser 1.61 0.44 - 1.00 mg/dL   Calcium 8.6 (L) 8.9 - 10.3 mg/dL   GFR, Estimated >09 >60 mL/min    Comment: (NOTE) Calculated using the CKD-EPI Creatinine Equation (2021)    Anion gap 7 5 - 15    Comment: Performed at [facility], [address] [place], [place] [address]  hCG, quantitative, pregnancy     Status: None   Collection Time: 01/06/23  5:42 AM  Result Value Ref Range   hCG, Beta Chain,  Quant, S 1 <5 mIU/mL    Comment:          GEST. AGE      CONC.  (mIU/mL)   <=1 WEEK        5 - 50     2 WEEKS       50 - 500     3 WEEKS       100 - 10,000     4 WEEKS     1,000 - 30,000     5 WEEKS     3,500 - 115,000   6-8 WEEKS     12,000 - 270,000    12 WEEKS     15,000 - 220,000        FEMALE AND NON-PREGNANT FEMALE:     LESS THAN 5 mIU/mL Performed at [facility], [address] [place], [place] [address]   Magnesium     Status: None   Collection Time: 01/06/23  5:42 AM  Result Value Ref Range   Magnesium 1.9 1.7 - 2.4 mg/dL    Comment: Performed at [facility], [address], [place], [place] [address]  Rapid urine drug screen (hospital performed)     Status: Abnormal   Collection Time: 01/06/23  8:55 AM  Result Value Ref Range   Opiates POSITIVE (A) NONE DETECTED   Cocaine POSITIVE (A) NONE DETECTED   Benzodiazepines POSITIVE (A) NONE DETECTED   Amphetamines NONE DETECTED NONE DETECTED   Tetrahydrocannabinol POSITIVE (A) NONE DETECTED   Barbiturates NONE DETECTED NONE DETECTED    Comment: (NOTE) DRUG SCREEN FOR MEDICAL PURPOSES ONLY.  IF CONFIRMATION IS NEEDED FOR ANY PURPOSE, NOTIFY LAB WITHIN 5 DAYS.  LOWEST DETECTABLE LIMITS FOR URINE DRUG SCREEN Drug Class                     Cutoff (ng/mL) Amphetamine and metabolites    1000 Barbiturate and metabolites    200 Benzodiazepine                 200 Opiates and metabolites        300 Cocaine and metabolites        300 THC                            50 Performed at [facility], [address] [place], [place]  [address]   Antithrombin III     Status: None   Collection Time: 01/06/23 10:16 AM  Result Value Ref Range   AntiThromb III Func 100 75 - 120 %    Comment: Performed at [facility], [address], [place], [place] [address]  Heparin level (unfractionated)     Status: Abnormal   Collection Time: 01/06/23  2:26 PM  Result Value Ref Range   Heparin Unfractionated 0.11 (L)  0.30 - 0.70 IU/mL    Comment: (NOTE) The clinical reportable range upper limit is being lowered to >1.10 to align with the FDA approved guidance for the current laboratory assay.  If heparin results are below expected values, and patient dosage has  been confirmed, suggest follow up testing of antithrombin III levels. Performed at [facility], [address] [place], [place] [address]    VAS [place] LOWER EXTREMITY VENOUS (DVT)  Result Date: 01/06/2023  Lower Venous DVT Study Patient Name:  [name]  Date of Exam:   01/06/2023 Medical Rec #: [id number]           Accession #:    [id number] Date of Birth: December 05, 1990           Patient Gender: F Patient Age:   71 years Exam Location:  [facility] Procedure:      VAS [place] LOWER EXTREMITY VENOUS (DVT) Referring Phys: Angelique Blonder WOLFE --------------------------------------------------------------------------------  Other Indications: Dural venous sinus thrombosis. Comparison Study: No prior studies. Performing Technologist: Jean Rosenthal RDMS, RVT  Examination Guidelines: A complete evaluation includes B-mode imaging, spectral Doppler, color Doppler, and power Doppler as needed of all accessible portions of each vessel. Bilateral testing is considered an integral part of a complete examination. Limited examinations for reoccurring indications may be performed as noted. The reflux portion of the exam is performed with the patient in reverse Trendelenburg.  +---------+---------------+---------+-----------+----------+--------------+ RIGHT    CompressibilityPhasicitySpontaneityPropertiesThrombus Aging +---------+---------------+---------+-----------+----------+--------------+ CFV      Full           Yes      Yes                                 +---------+---------------+---------+-----------+----------+--------------+ SFJ      Full                                                         +---------+---------------+---------+-----------+----------+--------------+ FV Prox  Full                                                        +---------+---------------+---------+-----------+----------+--------------+ FV Mid   Full           Yes      Yes                                 +---------+---------------+---------+-----------+----------+--------------+ FV DistalFull                                                        +---------+---------------+---------+-----------+----------+--------------+  PFV      Full                                                        +---------+---------------+---------+-----------+----------+--------------+ POP      Full           Yes      Yes                                 +---------+---------------+---------+-----------+----------+--------------+ PTV      Full                                                        +---------+---------------+---------+-----------+----------+--------------+ PERO     Full                                                        +---------+---------------+---------+-----------+----------+--------------+   +---------+---------------+---------+-----------+----------+--------------+ LEFT     CompressibilityPhasicitySpontaneityPropertiesThrombus Aging +---------+---------------+---------+-----------+----------+--------------+ CFV      Full           Yes      Yes                                 +---------+---------------+---------+-----------+----------+--------------+ SFJ      Full                                                        +---------+---------------+---------+-----------+----------+--------------+ FV Prox  Full                                                        +---------+---------------+---------+-----------+----------+--------------+ FV Mid   Full           Yes      Yes                                  +---------+---------------+---------+-----------+----------+--------------+ FV DistalFull                                                        +---------+---------------+---------+-----------+----------+--------------+ PFV      Full                                                        +---------+---------------+---------+-----------+----------+--------------+  POP      Full           Yes      Yes                                 +---------+---------------+---------+-----------+----------+--------------+ PTV      Full                                                        +---------+---------------+---------+-----------+----------+--------------+ PERO     Full                                                        +---------+---------------+---------+-----------+----------+--------------+     Summary: RIGHT: - There is no evidence of deep vein thrombosis in the lower extremity.  - No cystic structure found in the popliteal fossa.  LEFT: - There is no evidence of deep vein thrombosis in the lower extremity.  - No cystic structure found in the popliteal fossa.  *See table(s) above for measurements and observations.    Preliminary    VAS US CAROTID  Result Date: 01/06/2023 Carotid Arterial Duplex Study Patient Name:  [name]  Date of Exam:   01/06/2023 Medical Rec #: [id number]           Accession #:    [id number] Date of Birth: [birth year]           Patient Gender: F Patient Age:   89 years Exam [facility]  [facility] Procedure:      VAS US CAROTID Referring Phys: Erlinda Hong --------------------------------------------------------------------------------  Indications:       CVA and Headaches for months and left facial tingling for the                    last few days. Risk Factors:      Current smoker. Comparison Study:  No priors. Performing Technologist: Marilynne Halsted RDMS, RVT  Examination Guidelines: A complete evaluation includes B-mode imaging, spectral  Doppler, color Doppler, and power Doppler as needed of all accessible portions of each vessel. Bilateral testing is considered an integral part of a complete examination. Limited examinations for reoccurring indications may be performed as noted.  Right Carotid Findings: +----------+--------+--------+--------+------------------+------------------+           PSV cm/sEDV cm/sStenosisPlaque DescriptionComments           +----------+--------+--------+--------+------------------+------------------+ CCA Prox  114     22                                                   +----------+--------+--------+--------+------------------+------------------+ CCA Distal89      22                                intimal thickening +----------+--------+--------+--------+------------------+------------------+ ICA Prox  79      27  intimal thickening +----------+--------+--------+--------+------------------+------------------+ ICA Distal75      31                                                   +----------+--------+--------+--------+------------------+------------------+ ECA       78      19                                                   +----------+--------+--------+--------+------------------+------------------+ +----------+--------+-------+----------------+-------------------+           PSV cm/sEDV cmsDescribe        Arm Pressure (mmHG) +----------+--------+-------+----------------+-------------------+ [id number]            Multiphasic, WNL                    +----------+--------+-------+----------------+-------------------+ +---------+--------+--+--------+--+---------+ VertebralPSV cm/s58EDV cm/s21Antegrade +---------+--------+--+--------+--+---------+  Left Carotid Findings: +----------+--------+--------+--------+------------------+------------------+           PSV cm/sEDV cm/sStenosisPlaque DescriptionComments            +----------+--------+--------+--------+------------------+------------------+ CCA Prox  91      16                                                   +----------+--------+--------+--------+------------------+------------------+ CCA Distal79      23                                intimal thickening +----------+--------+--------+--------+------------------+------------------+ ICA Prox  82      32                                intimal thickening +----------+--------+--------+--------+------------------+------------------+ ICA Distal105     42                                                   +----------+--------+--------+--------+------------------+------------------+ ECA       70      16                                                   +----------+--------+--------+--------+------------------+------------------+ +----------+--------+--------+----------------+-------------------+           PSV cm/sEDV cm/sDescribe        Arm Pressure (mmHG) +----------+--------+--------+----------------+-------------------+ [id number]             Multiphasic, WNL                    +----------+--------+--------+----------------+-------------------+ +---------+--------+--+--------+--+---------+ VertebralPSV cm/s51EDV cm/s16Antegrade +---------+--------+--+--------+--+---------+   Summary: Right Carotid: The extracranial vessels were near-normal with only minimal wall                thickening or plaque. Left Carotid: The extracranial vessels were near-normal with only minimal wall  thickening or plaque. Vertebrals:  Bilateral vertebral arteries demonstrate antegrade flow. Subclavians: Normal flow hemodynamics were seen in bilateral subclavian              arteries. *See table(s) above for measurements and observations.  Electronically signed by Delia Heady MD on 01/06/2023 at 12:40:06 PM.    Final    CT VENOGRAM HEAD  Result Date: 01/06/2023 CLINICAL DATA:   Abnormal head CT. EXAM: CT VENOGRAM HEAD TECHNIQUE: Venographic phase images of the brain were obtained following the administration of intravenous contrast. Multiplanar reformats and maximum intensity projections were generated. RADIATION DOSE REDUCTION: This exam was performed according to the departmental dose-optimization program which includes automated exposure control, adjustment of the mA and/or kV according to patient size and/or use of iterative reconstruction technique. CONTRAST:  75mL OMNIPAQUE IOHEXOL 350 MG/ML SOLN COMPARISON:  Head CT from earlier today FINDINGS: Discrete occlusive clot with non filling extending from the torcula throughout the left transverse and sigmoid sinuses into the upper left IJ. The central and cortical veins are symmetrically enhancing. No evidence of brain edema or intracranial hemorrhage. Major arterial vessels are enhancing. 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 No brain edema or hemorrhage. Electronically Signed   By: Tiburcio Pea M.D.   On: 01/06/2023 06:03   CT Head Wo Contrast  Result Date: 01/06/2023 CLINICAL DATA:  Neck trauma with dangerous injury mechanism. EXAM: CT HEAD WITHOUT CONTRAST CT MAXILLOFACIAL WITHOUT CONTRAST CT CERVICAL SPINE WITHOUT CONTRAST TECHNIQUE: Multidetector CT imaging of the head, cervical spine, and maxillofacial structures were performed using the standard protocol without intravenous contrast. Multiplanar CT image reconstructions of the cervical spine and maxillofacial structures were also generated. RADIATION DOSE REDUCTION: This exam was performed according to the departmental dose-optimization program which includes automated exposure control, adjustment of the mA and/or kV according to patient size and/or use of iterative reconstruction technique. COMPARISON:  Head CT 07/26/2022 FINDINGS: CT HEAD FINDINGS Brain:  No evidence of acute infarction, hemorrhage, hydrocephalus, extra-axial collection or mass lesion/mass effect. Vascular: High-density appearance of the left transverse and sigmoid sinus also involving a probable cortical vein at the tentorium. Skull: Negative for fracture. CT MAXILLOFACIAL FINDINGS Osseous: Negative for mandibular fracture or dislocation. Advanced dental caries with multiple periapical erosions. Orbits: No evidence of injury Sinuses: No evidence of injury.  Anterior nasal septal perforation. Soft tissues: No evidence of injury CT CERVICAL SPINE FINDINGS Alignment: No traumatic malalignment Skull base and vertebrae: No acute fracture Soft tissues and spinal canal: No prevertebral fluid or swelling. No visible canal hematoma. Disc levels:  No evidence of cervical spine degeneration Upper chest: No evidence of injury IMPRESSION: 1. No evidence of intracranial or cervical spine injury. Negative for facial fracture. 2. High-density appearance at the left transverse and sigmoid dural sinuses, recommend CT venogram to evaluate for dural venous sinus thrombosis. Electronically Signed   By: Tiburcio Pea M.D.   On: 01/06/2023 04:27   CT Maxillofacial Wo Contrast  Result Date: 01/06/2023 CLINICAL DATA:  Neck trauma with dangerous injury mechanism. EXAM: CT HEAD WITHOUT CONTRAST CT MAXILLOFACIAL WITHOUT CONTRAST CT CERVICAL SPINE WITHOUT CONTRAST TECHNIQUE: Multidetector CT imaging of the head, cervical spine, and maxillofacial structures were performed using the standard protocol without intravenous contrast. Multiplanar CT image reconstructions of the cervical spine and maxillofacial structures were also generated. RADIATION DOSE REDUCTION: This exam was performed according to the departmental dose-optimization program which includes automated  exposure control, adjustment of the mA and/or kV according to patient size and/or use of iterative reconstruction technique. COMPARISON:  Head CT 07/26/2022  FINDINGS: CT HEAD FINDINGS Brain: No evidence of acute infarction, hemorrhage, hydrocephalus, extra-axial collection or mass lesion/mass effect. Vascular: High-density appearance of the left transverse and sigmoid sinus also involving a probable cortical vein at the tentorium. Skull: Negative for fracture. CT MAXILLOFACIAL FINDINGS Osseous: Negative for mandibular fracture or dislocation. Advanced dental caries with multiple periapical erosions. Orbits: No evidence of injury Sinuses: No evidence of injury.  Anterior nasal septal perforation. Soft tissues: No evidence of injury CT CERVICAL SPINE FINDINGS Alignment: No traumatic malalignment Skull base and vertebrae: No acute fracture Soft tissues and spinal canal: No prevertebral fluid or swelling. No visible canal hematoma. Disc levels:  No evidence of cervical spine degeneration Upper chest: No evidence of injury IMPRESSION: 1. No evidence of intracranial or cervical spine injury. Negative for facial fracture. 2. High-density appearance at the left transverse and sigmoid dural sinuses, recommend CT venogram to evaluate for dural venous sinus thrombosis. Electronically Signed   By: Tiburcio Pea M.D.   On: 01/06/2023 04:27   CT Cervical Spine Wo Contrast  Result Date: 01/06/2023 CLINICAL DATA:  Neck trauma with dangerous injury mechanism. EXAM: CT HEAD WITHOUT CONTRAST CT MAXILLOFACIAL WITHOUT CONTRAST CT CERVICAL SPINE WITHOUT CONTRAST TECHNIQUE: Multidetector CT imaging of the head, cervical spine, and maxillofacial structures were performed using the standard protocol without intravenous contrast. Multiplanar CT image reconstructions of the cervical spine and maxillofacial structures were also generated. RADIATION DOSE REDUCTION: This exam was performed according to the departmental dose-optimization program which includes automated exposure control, adjustment of the mA and/or kV according to patient size and/or use of iterative reconstruction technique.  COMPARISON:  Head CT 07/26/2022 FINDINGS: CT HEAD FINDINGS Brain: No evidence of acute infarction, hemorrhage, hydrocephalus, extra-axial collection or mass lesion/mass effect. Vascular: High-density appearance of the left transverse and sigmoid sinus also involving a probable cortical vein at the tentorium. Skull: Negative for fracture. CT MAXILLOFACIAL FINDINGS Osseous: Negative for mandibular fracture or dislocation. Advanced dental caries with multiple periapical erosions. Orbits: No evidence of injury Sinuses: No evidence of injury.  Anterior nasal septal perforation. Soft tissues: No evidence of injury CT CERVICAL SPINE FINDINGS Alignment: No traumatic malalignment Skull base and vertebrae: No acute fracture Soft tissues and spinal canal: No prevertebral fluid or swelling. No visible canal hematoma. Disc levels:  No evidence of cervical spine degeneration Upper chest: No evidence of injury IMPRESSION: 1. No evidence of intracranial or cervical spine injury. Negative for facial fracture. 2. High-density appearance at the left transverse and sigmoid dural sinuses, recommend CT venogram to evaluate for dural venous sinus thrombosis. Electronically Signed   By: Tiburcio Pea M.D.   On: 01/06/2023 04:27    Pending Labs Unresulted Labs (From admission, onward)     Start     Ordered   01/07/23 0500  Heparin level (unfractionated)  Daily,   R      01/06/23 0645   01/07/23 0500  Lipid panel  (Labs)  Tomorrow morning,   R       Comments: Fasting    01/06/23 0909   01/07/23 0500  HIV Antibody (routine testing w rflx)  (HIV Antibody (Routine testing w reflex) panel)  Tomorrow morning,   R        01/06/23 0927   01/07/23 0500  Lipid panel  (Labs)  Tomorrow morning,   R  Comments: Fasting    01/06/23 1700   01/06/23 2200  Heparin level (unfractionated)  Once-Timed,   TIMED        01/06/23 1549   01/06/23 0929  ANA w/Reflex if Positive  Once,   R        01/06/23 0928   01/06/23 0928  Protein C  activity  (Hypercoagulable Panel, Comprehensive (PNL))  Once,   URGENT        01/06/23 0927   01/06/23 0928  Protein C, total  (Hypercoagulable Panel, Comprehensive (PNL))  Once,   URGENT        01/06/23 0927   01/06/23 0928  Protein S activity  (Hypercoagulable Panel, Comprehensive (PNL))  Once,   URGENT        01/06/23 0927   01/06/23 0928  Protein S, total  (Hypercoagulable Panel, Comprehensive (PNL))  Once,   URGENT        01/06/23 0927   01/06/23 0928  Lupus anticoagulant panel  (Hypercoagulable Panel, Comprehensive (PNL))  Once,   URGENT        01/06/23 0927   01/06/23 0928  Beta-2-glycoprotein i abs, IgG/M/A  (Hypercoagulable Panel, Comprehensive (PNL))  Once,   URGENT        01/06/23 0927   01/06/23 0928  Homocysteine, serum  (Hypercoagulable Panel, Comprehensive (PNL))  Once,   URGENT        01/06/23 0927   01/06/23 0928  Factor 5 leiden  (Hypercoagulable Panel, Comprehensive (PNL))  Once,   URGENT        01/06/23 0927   01/06/23 0928  Prothrombin gene mutation  (Hypercoagulable Panel, Comprehensive (PNL))  Once,   URGENT        01/06/23 0927   01/06/23 0928  Cardiolipin antibodies, IgG, IgM, IgA  (Hypercoagulable Panel, Comprehensive (PNL))  Once,   URGENT        01/06/23 0927   01/06/23 0909  Hemoglobin A1c  (Labs)  Once,   URGENT       Comments: To assess prior glycemic control    01/06/23 0909            Vitals/Pain Today's Vitals   01/06/23 1430 01/06/23 1440 01/06/23 1602 01/06/23 1721  BP: 100/69 103/76  107/71  Pulse: 83 91  82  Resp: (!) 22 (!) 23    Temp:   97.8 F (36.6 C) 97.7 F (36.5 C)  TempSrc:    Oral  SpO2: 100% 99%  99%  Weight:      Height:      PainSc:        Isolation Precautions No active isolations  Medications Medications  heparin ADULT infusion 100 units/mL (25000 units/231mL) (1,200 Units/hr Intravenous Rate/Dose Change 01/06/23 1603)  acetaminophen (TYLENOL) tablet 650 mg (650 mg Oral Given 01/06/23 1013)    Or  acetaminophen  (TYLENOL) suppository 650 mg ( Rectal See Alternative 01/06/23 1013)  senna-docusate (Senokot-S) tablet 1 tablet (has no administration in time range)  ondansetron (ZOFRAN) tablet 4 mg (has no administration in time range)    Or  ondansetron (ZOFRAN) injection 4 mg (has no administration in time range)  nicotine (NICODERM CQ - dosed in mg/24 hours) patch 14 mg (14 mg Transdermal Patch Applied 01/06/23 1224)  potassium chloride SA (KLOR-CON M) CR tablet 40 mEq (40 mEq Oral Given 01/06/23 1223)   stroke: early stages of recovery book (has no administration in time range)  metoCLOPramide (REGLAN) injection 10 mg (10 mg Intravenous Given 01/06/23 0451)  diphenhydrAMINE (  BENADRYL) injection 25 mg (25 mg Intravenous Given 01/06/23 0459)  dexamethasone (DECADRON) injection 10 mg (10 mg Intravenous Given 01/06/23 0459)  HYDROmorphone (DILAUDID) injection 1 mg (1 mg Intravenous Given 01/06/23 0455)  iohexol (OMNIPAQUE) 350 MG/ML injection 75 mL (75 mLs Intravenous Contrast Given 01/06/23 0554)  heparin bolus via infusion 4,000 Units (4,000 Units Intravenous Bolus from Bag 01/06/23 0726)    Mobility walks     Focused Assessments Cardiac Assessment Handoff:  Cardiac Rhythm: Normal sinus rhythm No results found for: "CKTOTAL", "CKMB", "CKMBINDEX", "TROPONINI" No results found for: "DDIMER" Does the Patient currently have chest pain? No   , Neuro Assessment Handoff:  Swallow screen pass? Yes  Cardiac Rhythm: Normal sinus rhythm NIH Stroke Scale  Dizziness Present: No Headache Present: Yes Interval: Shift assessment Level of Consciousness (1a.)   : Alert, keenly responsive LOC Questions (1b. )   : Answers both questions correctly LOC Commands (1c. )   : Performs both tasks correctly Best Gaze (2. )  : Normal Visual (3. )  : No visual loss Facial Palsy (4. )    : Normal symmetrical movements Motor Arm, Left (5a. )   : No drift Motor Arm, Right (5b. ) : No drift Motor Leg, Left (6a. )  : No drift Motor  Leg, Right (6b. ) : No drift Limb Ataxia (7. ): Absent Sensory (8. )  : Normal, no sensory loss Best Language (9. )  : No aphasia Dysarthria (10. ): Normal Extinction/Inattention (11.)   : No Abnormality Complete NIHSS TOTAL: 0     Neuro Assessment: Within Defined Limits Neuro Checks:   Initial (01/06/23 0735)  Has TPA been given? No If patient is a Neuro Trauma and patient is going to OR before floor call report to 4N Charge nurse: [phone] or [phone]   R Recommendations: See Admitting Provider Note  Report given to:   Additional Notes: Pt alert and oriented, c/o slight headache.

## 2023-01-06 NOTE — Plan of Care (Signed)

## 2023-01-06 NOTE — ED Triage Notes (Signed)
Pt arrived from home with mother. Reports hitting her head on her vehicle Friday. Was seen at [facility] last night for this. Said the top of head, down the left side of face is throbbing and hurting. Nauseous, dizzy and a headache since.

## 2023-01-06 NOTE — ED Provider Notes (Signed)
[place] EMERGENCY DEPARTMENT AT [facility] Provider Note   CSN: [id number] Arrival date & time: 01/06/23  0154     History {Add pertinent medical, surgical, social history, OB history to HPI:1} Chief Complaint  Patient presents with   Head Injury    [name] is a 32 y.o. female.  Here with persistent left sided headache, nausea, photosensitivity, confusion after head trauma on Friday. No neuro changes. Also has some neck and left mandibular pain resulting from the fall as well. Had gone to [facility] but it sounds like most of these symptoms weren't present at that time so it wasn't felt that she needed imaging at that time but things have worsened since then. Tried some OTC meds without relief.    Head Injury      Home Medications Prior to Admission medications   Medication Sig Start Date End Date Taking? Authorizing Provider  acetaminophen (TYLENOL) 500 MG tablet Take 500 mg by mouth every 6 (six) hours as needed for mild pain, moderate pain or headache.    [provider]  azithromycin (ZITHROMAX) 250 MG tablet Take 1 tablet (250 mg total) by mouth daily. Take first 2 tablets together, then 1 every day until finished. 07/26/22   Darrick Grinder, PA-C  meloxicam (MOBIC) 15 MG tablet Take 1 tablet (15 mg total) by mouth daily for 14 days. 01/04/23 01/18/23  Eber Hong, MD  ondansetron (ZOFRAN-ODT) 4 MG disintegrating tablet Take 1 tablet (4 mg total) by mouth every 8 (eight) hours as needed for nausea. 01/04/23   Eber Hong, MD      Allergies    Gentamicin and Penicillins    Review of Systems   Review of Systems  Physical Exam Updated Vital Signs BP 120/78 (BP Location: Right Arm)   Pulse 70   Temp 98.3 F (36.8 C) (Oral)   Resp 18   Ht 5\' 3"  (1.6 m)   Wt 57.2 kg   LMP 12/28/2022 (Approximate)   SpO2 96%   BMI 22.32 kg/m  Physical Exam Vitals and nursing note reviewed.  Constitutional:      Appearance: She is well-developed.   HENT:     Head: Normocephalic and atraumatic.     Comments: Ttp over L temporal area, angle of left mandible and crown of the head. No deformities.  Eyes:     Pupils: Pupils are equal, round, and reactive to light.  Cardiovascular:     Rate and Rhythm: Normal rate and regular rhythm.  Pulmonary:     Effort: No respiratory distress.     Breath sounds: No stridor.  Abdominal:     General: Abdomen is flat. There is no distension.  Musculoskeletal:     Cervical back: Normal range of motion.  Skin:    General: Skin is warm and dry.  Neurological:     General: No focal deficit present.     Mental Status: She is alert.     ED Results / Procedures / Treatments   Labs (all labs ordered are listed, but only abnormal results are displayed) Labs Reviewed  CBC WITH DIFFERENTIAL/PLATELET - Abnormal; Notable for the following components:      Result Value   MCV 101.0 (*)    All other components within normal limits  BASIC METABOLIC PANEL - Abnormal; Notable for the following components:   Potassium 3.3 (*)    Calcium 8.6 (*)    All other components within normal limits  HCG, QUANTITATIVE, PREGNANCY  PREGNANCY,  URINE    EKG None  Radiology CT VENOGRAM HEAD  Result Date: 01/06/2023 CLINICAL DATA:  Abnormal head CT. EXAM: CT VENOGRAM HEAD TECHNIQUE: Venographic phase images of the brain were obtained following the administration of intravenous contrast. Multiplanar reformats and maximum intensity projections were generated. RADIATION DOSE REDUCTION: This exam was performed according to the departmental dose-optimization program which includes automated exposure control, adjustment of the mA and/or kV according to patient size and/or use of iterative reconstruction technique. CONTRAST:  75mL OMNIPAQUE IOHEXOL 350 MG/ML SOLN COMPARISON:  Head CT from earlier today FINDINGS: Discrete occlusive clot with non filling extending from the torcula throughout the left transverse and sigmoid sinuses  into the upper left IJ. The central and cortical veins are symmetrically enhancing. No evidence of brain edema or intracranial hemorrhage. Major arterial vessels are enhancing. 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 No brain edema or hemorrhage. Electronically Signed   By: Tiburcio Pea M.D.   On: 01/06/2023 06:03   CT Head Wo Contrast  Result Date: 01/06/2023 CLINICAL DATA:  Neck trauma with dangerous injury mechanism. EXAM: CT HEAD WITHOUT CONTRAST CT MAXILLOFACIAL WITHOUT CONTRAST CT CERVICAL SPINE WITHOUT CONTRAST TECHNIQUE: Multidetector CT imaging of the head, cervical spine, and maxillofacial structures were performed using the standard protocol without intravenous contrast. Multiplanar CT image reconstructions of the cervical spine and maxillofacial structures were also generated. RADIATION DOSE REDUCTION: This exam was performed according to the departmental dose-optimization program which includes automated exposure control, adjustment of the mA and/or kV according to patient size and/or use of iterative reconstruction technique. COMPARISON:  Head CT 07/26/2022 FINDINGS: CT HEAD FINDINGS Brain: No evidence of acute infarction, hemorrhage, hydrocephalus, extra-axial collection or mass lesion/mass effect. Vascular: High-density appearance of the left transverse and sigmoid sinus also involving a probable cortical vein at the tentorium. Skull: Negative for fracture. CT MAXILLOFACIAL FINDINGS Osseous: Negative for mandibular fracture or dislocation. Advanced dental caries with multiple periapical erosions. Orbits: No evidence of injury Sinuses: No evidence of injury.  Anterior nasal septal perforation. Soft tissues: No evidence of injury CT CERVICAL SPINE FINDINGS Alignment: No traumatic malalignment Skull base and vertebrae: No acute fracture Soft tissues and spinal canal: No prevertebral  fluid or swelling. No visible canal hematoma. Disc levels:  No evidence of cervical spine degeneration Upper chest: No evidence of injury IMPRESSION: 1. No evidence of intracranial or cervical spine injury. Negative for facial fracture. 2. High-density appearance at the left transverse and sigmoid dural sinuses, recommend CT venogram to evaluate for dural venous sinus thrombosis. Electronically Signed   By: Tiburcio Pea M.D.   On: 01/06/2023 04:27   CT Maxillofacial Wo Contrast  Result Date: 01/06/2023 CLINICAL DATA:  Neck trauma with dangerous injury mechanism. EXAM: CT HEAD WITHOUT CONTRAST CT MAXILLOFACIAL WITHOUT CONTRAST CT CERVICAL SPINE WITHOUT CONTRAST TECHNIQUE: Multidetector CT imaging of the head, cervical spine, and maxillofacial structures were performed using the standard protocol without intravenous contrast. Multiplanar CT image reconstructions of the cervical spine and maxillofacial structures were also generated. RADIATION DOSE REDUCTION: This exam was performed according to the departmental dose-optimization program which includes automated exposure control, adjustment of the mA and/or kV according to patient size and/or use of iterative reconstruction technique. COMPARISON:  Head CT 07/26/2022 FINDINGS: CT HEAD FINDINGS Brain: No evidence of acute infarction, hemorrhage, hydrocephalus, extra-axial collection or mass lesion/mass effect. Vascular: High-density appearance of the left transverse and sigmoid sinus also  involving a probable cortical vein at the tentorium. Skull: Negative for fracture. CT MAXILLOFACIAL FINDINGS Osseous: Negative for mandibular fracture or dislocation. Advanced dental caries with multiple periapical erosions. Orbits: No evidence of injury Sinuses: No evidence of injury.  Anterior nasal septal perforation. Soft tissues: No evidence of injury CT CERVICAL SPINE FINDINGS Alignment: No traumatic malalignment Skull base and vertebrae: No acute fracture Soft tissues and  spinal canal: No prevertebral fluid or swelling. No visible canal hematoma. Disc levels:  No evidence of cervical spine degeneration Upper chest: No evidence of injury IMPRESSION: 1. No evidence of intracranial or cervical spine injury. Negative for facial fracture. 2. High-density appearance at the left transverse and sigmoid dural sinuses, recommend CT venogram to evaluate for dural venous sinus thrombosis. Electronically Signed   By: Tiburcio Pea M.D.   On: 01/06/2023 04:27   CT Cervical Spine Wo Contrast  Result Date: 01/06/2023 CLINICAL DATA:  Neck trauma with dangerous injury mechanism. EXAM: CT HEAD WITHOUT CONTRAST CT MAXILLOFACIAL WITHOUT CONTRAST CT CERVICAL SPINE WITHOUT CONTRAST TECHNIQUE: Multidetector CT imaging of the head, cervical spine, and maxillofacial structures were performed using the standard protocol without intravenous contrast. Multiplanar CT image reconstructions of the cervical spine and maxillofacial structures were also generated. RADIATION DOSE REDUCTION: This exam was performed according to the departmental dose-optimization program which includes automated exposure control, adjustment of the mA and/or kV according to patient size and/or use of iterative reconstruction technique. COMPARISON:  Head CT 07/26/2022 FINDINGS: CT HEAD FINDINGS Brain: No evidence of acute infarction, hemorrhage, hydrocephalus, extra-axial collection or mass lesion/mass effect. Vascular: High-density appearance of the left transverse and sigmoid sinus also involving a probable cortical vein at the tentorium. Skull: Negative for fracture. CT MAXILLOFACIAL FINDINGS Osseous: Negative for mandibular fracture or dislocation. Advanced dental caries with multiple periapical erosions. Orbits: No evidence of injury Sinuses: No evidence of injury.  Anterior nasal septal perforation. Soft tissues: No evidence of injury CT CERVICAL SPINE FINDINGS Alignment: No traumatic malalignment Skull base and vertebrae: No  acute fracture Soft tissues and spinal canal: No prevertebral fluid or swelling. No visible canal hematoma. Disc levels:  No evidence of cervical spine degeneration Upper chest: No evidence of injury IMPRESSION: 1. No evidence of intracranial or cervical spine injury. Negative for facial fracture. 2. High-density appearance at the left transverse and sigmoid dural sinuses, recommend CT venogram to evaluate for dural venous sinus thrombosis. Electronically Signed   By: Tiburcio Pea M.D.   On: 01/06/2023 04:27    Procedures .Critical Care  Performed by: Marily Memos, MD Authorized by: Marily Memos, MD   Critical care provider statement:    Critical care time (minutes):  30   Critical care was necessary to treat or prevent imminent or life-threatening deterioration of the following conditions:  CNS failure or compromise and circulatory failure   Critical care was time spent personally by me on the following activities:  Development of treatment plan with patient or surrogate, discussions with consultants, evaluation of patient's response to treatment, examination of patient, ordering and review of laboratory studies, ordering and review of radiographic studies, ordering and performing treatments and interventions, pulse oximetry, re-evaluation of patient's condition and review of old charts     Medications Ordered in ED Medications  metoCLOPramide (REGLAN) injection 10 mg (10 mg Intravenous Given 01/06/23 0451)  diphenhydrAMINE (BENADRYL) injection 25 mg (25 mg Intravenous Given 01/06/23 0459)  dexamethasone (DECADRON) injection 10 mg (10 mg Intravenous Given 01/06/23 0459)  HYDROmorphone (DILAUDID) injection 1 mg (1 mg  Intravenous Given 01/06/23 0455)  iohexol (OMNIPAQUE) 350 MG/ML injection 75 mL (75 mLs Intravenous Contrast Given 01/06/23 0554)    ED Course/ Medical Decision Making/ A&P                             Medical Decision Making Amount and/or Complexity of Data Reviewed Labs:  ordered. Radiology: ordered.  Risk Prescription drug management.  Discussed CT head wo with Radiology who is concerned for possible sinus thrombosis. Will check labs and upreg and get venogram.  Venogram positive. D/w neurology, will see, recommending heparin and admit. Medicine paged.   Final Clinical Impression(s) / ED Diagnoses Final diagnoses:  None    Rx / DC Orders ED Discharge Orders     None

## 2023-01-06 NOTE — Progress Notes (Signed)
Lower extremity venous bilateral study completed.   Please see CV Proc for preliminary results.   Eltha Tingley, RDMS, RVT  

## 2023-01-06 NOTE — H&P (Signed)
Date: 01/06/2023               Patient Name:  [name] MRN: [id number]  DOB: 10/07/1990 Age / Sex: 32 y.o., female   PCP: Chrys Racer, MD         Medical Service: Internal Medicine Teaching Service         Attending Physician: Dr. Oswaldo Done, Marquita Palms, *    First Contact: Dr. Versie Starks Pager: [phone]  Second Contact: Dr. Ned Card Pager: [phone]       After Hours (After 5p/  First Contact Pager: [phone]  weekends / holidays): Second Contact Pager: [phone]   Chief Complaint: Headache  History of Present Illness:  Pt is a 32 year old female without significant past medical hx presenting to the ED for worsening headache. She had a fall on 01/04/23 and was discharged with the ED after diagnosis of concussion. The headache has been worsening requiring pt to present to the ED. although states she has been having headaches for around 6-7 months that are severe in nature. They are diffuse.   States after the fall, pt had tingling on the left side and difficulty opening her mouth to eat but these resolved. When asked how she fell, she states after drinking alcohol she slipped while running.  On further hx of her headaches, she states her headaches are accompanied by blurry vision. She states it can cause nausea and lead to vomiting at times around once a month. Pt reports an episode of vision loss for 15 minute with slight headache about 2 weeks ago. It started off with seeing spots and progressed to complete vision loss. She does report a decrease in appetite in the last few days and states EDP told her this can be expected.   Review of Systems negative unless stated in the HPI.  In the ED, pt had CT head that showed concern for dural venous sinus thrombosis. No edema or hemorrhage noted. 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 Neurology was  consulted and IMTS called for admission.   Past Medical History: Ruptured Appendix s/p appendectomy  Meds:  None chronic but was started on meloxicam and zofran by ED recently.   Allergies: Allergies as of 01/06/2023 - Review Complete 01/06/2023  Allergen Reaction Noted   Gentamicin Rash 07/04/2011   Penicillins Rash 07/04/2011   Past Surgical History: Past Surgical History:  Procedure Laterality Date   APPENDECTOMY     Family History:  Family History  Problem Relation Age of Onset   Diabetes Mother    Hypertension Mother   DM:Mother VHQ:IONGEX HLD: Mother  Social History:  Lives with boyfriend. Has 2 pitbulls. No kids. Works at Tobacco Use 1ppd since 17 Alcohol Use boot leggers, liquor drinks daily since 20. Drinks 3 boot leggers daily.   Illicit substance use: marijuana, cocaine previously ADLs/iADLs: independent in both but doesn't drive due to a DUI.   Physical Exam: Blood pressure 101/74, pulse 84, temperature 97.9 F (36.6 C), temperature source Oral, resp. rate 20, height 5\' 3"  (1.6 m), weight 57.2 kg, last menstrual period 12/28/2022, SpO2 97 %. General: Young lady in NAD HENT: NCAT Lungs: CTAB Cardiovascular: NSR, good pulses in all extremities Abdomen: No TTP MSK: no asymmetry, good bulk and tone.  Skin: no lesions Neuro: alert and oriented x4. CNII-XII intact. Good strength and sensation in all extremities. No focal  deficits.  Psych: Normal mood and normal affect  Diagnostics:     Latest Ref Rng & Units 01/06/2023    4:45 AM 07/26/2022   12:18 AM 01/27/2020    6:34 PM  CBC  WBC 4.0 - 10.5 K/uL 9.9  9.1  9.4   Hemoglobin 12.0 - 15.0 g/dL 16.1  09.6  04.5   Hematocrit 36.0 - 46.0 % 40.1  41.2  43.9   Platelets 150 - 400 K/uL 280  409  340        Latest Ref Rng & Units 01/06/2023    4:45 AM 07/26/2022   12:18 AM 01/27/2020    6:34 PM  CMP  Glucose 70 - 99 mg/dL 90  409  811   BUN 6 - 20 mg/dL 8  7  9    Creatinine 0.44 - 1.00 mg/dL 9.14  7.82  9.56    Sodium 135 - 145 mmol/L 138  139  135   Potassium 3.5 - 5.1 mmol/L 3.3  3.6  3.3   Chloride 98 - 111 mmol/L 104  104  103   CO2 22 - 32 mmol/L 27  26  24    Calcium 8.9 - 10.3 mg/dL 8.6  8.9  8.9     CT VENOGRAM HEAD  Result Date: 01/06/2023 CLINICAL DATA:  Abnormal head CT. EXAM: CT VENOGRAM HEAD IMPRESSION: Occlusive dural venous sinus thrombosis extending from the torcula to the left IJ and including the entire left transverse and sigmoid sinuses. No brain edema or hemorrhage. Electronically Signed   By: Tiburcio Pea M.D.   On: 01/06/2023 06:03   CT Head Wo Contrast  Result Date: 01/06/2023 CLINICAL DATA:  Neck trauma with dangerous injury mechanism. EXAM: CT HEAD WITHOUT CONTRAST CT MAXILLOFACIAL WITHOUT CONTRAST CT CERVICAL SPINE WITHOUT CONTRAST  IMPRESSION: 1. No evidence of intracranial or cervical spine injury. Negative for facial fracture. 2. High-density appearance at the left transverse and sigmoid dural sinuses, recommend CT venogram to evaluate for dural venous sinus thrombosis. Electronically Signed   By: Tiburcio Pea M.D.   On: 01/06/2023 04:27   CT Maxillofacial Wo Contrast  Result Date: 01/06/2023 CLINICAL DATA:  Neck trauma with dangerous injury mechanism. EXAM: CT HEAD WITHOUT CONTRAST CT MAXILLOFACIAL WITHOUT CONTRAST CT CERVICAL SPINE WITHOUT CONTRAST  IMPRESSION: 1. No evidence of intracranial or cervical spine injury. Negative for facial fracture. 2. High-density appearance at the left transverse and sigmoid dural sinuses, recommend CT venogram to evaluate for dural venous sinus thrombosis. Electronically Signed   By: Tiburcio Pea M.D.   On: 01/06/2023 04:27   CT Cervical Spine Wo Contrast  Result Date: 01/06/2023 CLINICAL DATA:  Neck trauma with dangerous injury mechanism. EXAM: CT HEAD WITHOUT CONTRAST CT MAXILLOFACIAL WITHOUT CONTRAST CT CERVICAL SPINE WITHOUT CONTRAST  IMPRESSION: 1. No evidence of intracranial or cervical spine injury. Negative for facial  fracture. 2. High-density appearance at the left transverse and sigmoid dural sinuses, recommend CT venogram to evaluate for dural venous sinus thrombosis. Electronically Signed   By: Tiburcio Pea M.D.   On: 01/06/2023 04:27   Assessment & Plan by Problem: Dural Venous Sinus Thrombosis Patient with chronic moderate to severe headaches presenting with her complaint of headache after traumatic event found to have dural venous sinus thrombosis.  Unsure of the chronicity of this but appears to be chronic in nature given her duration of symptoms.  She did suffer trauma to the head which can be a risk factor for this problem is sinus thrombosis but  not likely one suggest chronic nature.  History of headaches that are similar in nature made back to last 6 months making this chronic problem rather than acute.  Patient was started on IV heparin.  Neurology was consulted and recommended keeping patient on IV heparin for 1 to 2 days and then switching to oral AC.  They have ordered studies to look for an underlying coagulopathy as patient does not use OCPs and has no other risk factors.  Lab work was collected after heparin was initiated so we will have to interpret accordingly.  Fortunately patient is doing well and has no neurodeficits.  Per neurology, IR and neurosurgery consult can be held off unless patient develops any neurosymptoms.  Appreciate neurology assistance with this patient. - Continue IV heparin, with a plan to switch him to eliquis 5 mg BID in the next day or so.  Repeat imaging may be needed prior to switch to rule out any hemorrhage per neurology. - Follow-up on hypercoagulable workup and other imaging studies ordered by neurology - Elevate head of bed to minimize ICP.  Treat pain conservatively and trend pain at this point intracranial pathology. - Neuro-checks every 4 hours - If patient has focal neurodeficits, alert neurology and consider neurosurgery or IR consult.  Hypokalemia Labwork  shows K at 3.3.  Suspect this is secondary to recent poor oral intake.  Will check magnesium level and replete but no indication for aggressive repletion.  Will advise patient to increase her oral intake.  Alcohol use disorder Patient states she drinks multiple alcoholic drinks including liquor, and [employer] (12 % alcohol/volume).  She has been drinking since the age of 75.  States he has never had any withdrawal symptoms but has never been without alcohol. - CIWA without Ativan  Polysubstance Use Patient's urine positive for cocaine, benzodiazepines, THC.  Patient denies any benzodiazepine or cocaine use currently.  Please discuss this with the patient at follow-up discussion and placed to see referral for substance use assistance.   Episode of vision loss Unknown etiology but could be secondary to her primary problem in the setting of increased ICP.  Carotid Dopplers without any stenosis.  Further imaging has been reassuring.  Will also follow-up on MRI of the brain.  Will have patient discuss this with her PCP and monitor for recurrence as we are not treating a possible etiology of this. -Continue to monitor  DVT prophx: Heparin gtt Diet: Regular Bowel: PRN Code: Full  Prior to Admission Living Arrangement: Home Anticipated Discharge Location: Home Barriers to Discharge: Medical Workup  Dispo: Admit patient to Observation with expected length of stay less than 2 midnights.  Gwenevere Abbot, MD Eligha Bridegroom. [facility] Internal Medicine Residency, PGY-3 Pager: [phone]

## 2023-01-06 NOTE — Progress Notes (Signed)
ANTICOAGULATION CONSULT NOTE - Follow Up Consult  Pharmacy Consult for Heparin Indication:  Sinus thrombosis  Allergies  Allergen Reactions   Gentamicin Rash   Penicillins Rash    Patient Measurements: Height: 5\' 3"  (160 cm) Weight: 57.2 kg (126 lb) IBW/kg (Calculated) : 52.4 Heparin Dosing Weight: 57.2 kg  Vital Signs: Temp: 97.9 F (36.6 C) (07/08 1136) Temp Source: Oral (07/08 1136) BP: 98/72 (07/08 1300) Pulse Rate: 83 (07/08 1300)  Labs: Recent Labs    01/06/23 0445  HGB 13.5  HCT 40.1  PLT 280  CREATININE 0.53    Estimated Creatinine Clearance: 83.5 mL/min (by C-G formula based on SCr of 0.53 mg/dL).  Assessment: 57 YOF presented with headache, no evidence of anticoagulation PTA. CT head significant for dural venous sinus thrombosis. Received heparin bolus 4000 units IV x1, and started on heparin gtt 1000 units/hr. PM heparin level subtherapeutic at 0.11. CBC wnl. H/H stable. No signs/symptoms of bleeding.   Goal of Therapy:  Heparin level 0.3-0.7 units/ml Monitor platelets by anticoagulation protocol: Yes   Plan:  Start heparin infusion at 1200 units/hr Continue to monitor H&H and platelets Check heparin level every 6-hours  Laqueta Jean PharmD Candidate 01/06/2023 3:41 PM

## 2023-01-06 NOTE — ED Notes (Signed)
Pt ambulatory to bathroom

## 2023-01-07 ENCOUNTER — Other Ambulatory Visit (HOSPITAL_COMMUNITY): Payer: Self-pay

## 2023-01-07 ENCOUNTER — Inpatient Hospital Stay (HOSPITAL_COMMUNITY): Payer: Medicaid Other

## 2023-01-07 ENCOUNTER — Encounter (HOSPITAL_COMMUNITY): Payer: Self-pay | Admitting: Student in an Organized Health Care Education/Training Program

## 2023-01-07 DIAGNOSIS — G08 Intracranial and intraspinal phlebitis and thrombophlebitis: Secondary | ICD-10-CM

## 2023-01-07 DIAGNOSIS — E7211 Homocystinuria: Secondary | ICD-10-CM | POA: Diagnosis not present

## 2023-01-07 DIAGNOSIS — F1410 Cocaine abuse, uncomplicated: Secondary | ICD-10-CM

## 2023-01-07 DIAGNOSIS — R29818 Other symptoms and signs involving the nervous system: Secondary | ICD-10-CM | POA: Diagnosis not present

## 2023-01-07 DIAGNOSIS — F1290 Cannabis use, unspecified, uncomplicated: Secondary | ICD-10-CM

## 2023-01-07 DIAGNOSIS — I82C12 Acute embolism and thrombosis of left internal jugular vein: Secondary | ICD-10-CM | POA: Diagnosis not present

## 2023-01-07 DIAGNOSIS — N920 Excessive and frequent menstruation with regular cycle: Secondary | ICD-10-CM | POA: Diagnosis not present

## 2023-01-07 LAB — ECHOCARDIOGRAM COMPLETE
AR max vel: 2.3 cm2
AV Area VTI: 2.37 cm2
AV Area mean vel: 2.32 cm2
AV Mean grad: 5 mmHg
AV Peak grad: 7.2 mmHg
Ao pk vel: 1.34 m/s
Area-P 1/2: 3.3 cm2
Calc EF: 64.8 %
Height: 63 in
S' Lateral: 2.5 cm
Single Plane A2C EF: 67.8 %
Single Plane A4C EF: 61 %
Weight: 2045.87 oz

## 2023-01-07 LAB — CBC
HCT: 36 % (ref 36.0–46.0)
Hemoglobin: 12.1 g/dL (ref 12.0–15.0)
MCH: 34.3 pg — ABNORMAL HIGH (ref 26.0–34.0)
MCHC: 33.6 g/dL (ref 30.0–36.0)
MCV: 102 fL — ABNORMAL HIGH (ref 80.0–100.0)
Platelets: 267 10*3/uL (ref 150–400)
RBC: 3.53 MIL/uL — ABNORMAL LOW (ref 3.87–5.11)
RDW: 15.4 % (ref 11.5–15.5)
WBC: 14.5 10*3/uL — ABNORMAL HIGH (ref 4.0–10.5)
nRBC: 0 % (ref 0.0–0.2)

## 2023-01-07 LAB — LIPID PANEL
Cholesterol: 117 mg/dL (ref 0–200)
HDL: 40 mg/dL — ABNORMAL LOW (ref >40)
LDL Cholesterol: 58 mg/dL (ref 0–99)
Total CHOL/HDL Ratio: 2.9 RATIO
Triglycerides: 96 mg/dL (ref <150)
VLDL: 19 mg/dL (ref 0–40)

## 2023-01-07 LAB — ANA W/REFLEX IF POSITIVE: Anti Nuclear Antibody (ANA): NEGATIVE

## 2023-01-07 LAB — HEMOGLOBIN A1C
Hgb A1c MFr Bld: 5.2 % (ref 4.8–5.6)
Mean Plasma Glucose: 103 mg/dL

## 2023-01-07 LAB — HIV ANTIBODY (ROUTINE TESTING W REFLEX): HIV Screen 4th Generation wRfx: NONREACTIVE

## 2023-01-07 LAB — IRON AND TIBC
Iron: 17 ug/dL — ABNORMAL LOW (ref 28–170)
Saturation Ratios: 5 % — ABNORMAL LOW (ref 10.4–31.8)
TIBC: 326 ug/dL (ref 250–450)
UIBC: 309 ug/dL

## 2023-01-07 LAB — HEPARIN LEVEL (UNFRACTIONATED)
Heparin Unfractionated: 0.35 IU/mL (ref 0.30–0.70)
Heparin Unfractionated: 0.44 IU/mL (ref 0.30–0.70)

## 2023-01-07 LAB — BETA-2-GLYCOPROTEIN I ABS, IGG/M/A
Beta-2 Glyco I IgG: <9 GPI IgG units (ref 0–20)
Beta-2-Glycoprotein I IgA: <9 GPI IgA units (ref 0–25)
Beta-2-Glycoprotein I IgM: <9 GPI IgM units (ref 0–32)

## 2023-01-07 LAB — HOMOCYSTEINE: Homocysteine: 54.8 umol/L — ABNORMAL HIGH (ref 0.0–14.5)

## 2023-01-07 LAB — FERRITIN: Ferritin: 33 ng/mL (ref 11–307)

## 2023-01-07 MED ORDER — KETOROLAC TROMETHAMINE 15 MG/ML IJ SOLN
15.0000 mg | Freq: Once | INTRAMUSCULAR | Status: AC
  Administered 2023-01-07: 15 mg via INTRAVENOUS
  Filled 2023-01-07: qty 1

## 2023-01-07 MED ORDER — VITAMIN B-6 25 MG PO TABS
50.0000 mg | ORAL_TABLET | Freq: Every day | ORAL | Status: DC
  Administered 2023-01-07 – 2023-01-08 (×2): 50 mg via ORAL
  Filled 2023-01-07 (×2): qty 2

## 2023-01-07 MED ORDER — SODIUM CHLORIDE 0.9 % IV SOLN
250.0000 mg | Freq: Once | INTRAVENOUS | Status: AC
  Administered 2023-01-07: 250 mg via INTRAVENOUS
  Filled 2023-01-07: qty 20

## 2023-01-07 MED ORDER — FOLIC ACID 1 MG PO TABS
1.0000 mg | ORAL_TABLET | Freq: Every day | ORAL | Status: DC
  Administered 2023-01-07 – 2023-01-08 (×2): 1 mg via ORAL
  Filled 2023-01-07 (×2): qty 1

## 2023-01-07 MED ORDER — VITAMIN B-12 100 MCG PO TABS
500.0000 ug | ORAL_TABLET | Freq: Every day | ORAL | Status: DC
  Administered 2023-01-07 – 2023-01-08 (×2): 500 ug via ORAL
  Filled 2023-01-07 (×2): qty 5

## 2023-01-07 NOTE — TOC CM/SW Note (Signed)
Transition of Care [facility]) - Inpatient Brief Assessment   Patient Details  Name: KYUNG MUTO MRN: [id number] Date of Birth: 1991/05/18  [facility] CM/SW Contact:    Kermit Balo, RN Phone Number: 01/07/2023, 4:12 PM   Clinical Narrative: No f/u per PT/OT.   Transition of Care Asessment: Insurance and Status: Insurance coverage has been reviewed Patient has primary care physician: Yes Home environment has been reviewed: home with spouse   Prior/Current Home Services: No current home services Social Determinants of Health Reivew: SDOH reviewed no interventions necessary Readmission risk has been reviewed: Yes Transition of care needs: no transition of care needs at this time

## 2023-01-07 NOTE — Plan of Care (Signed)
  Problem: Education: Goal: Knowledge of disease or condition will improve Outcome: Progressing Goal: Knowledge of patient specific risk factors will improve [name] N/A or DELETE if not current risk factor) Outcome: Progressing   Problem: Ischemic Stroke/TIA Tissue Perfusion: Goal: Complications of ischemic stroke/TIA will be minimized Outcome: Progressing   Problem: Coping: Goal: Will verbalize positive feelings about self Outcome: Progressing Goal: Will identify appropriate support needs Outcome: Progressing   Problem: Self-Care: Goal: Ability to participate in self-care as condition permits will improve Outcome: Progressing Goal: Ability to communicate needs accurately will improve Outcome: Progressing

## 2023-01-07 NOTE — Progress Notes (Signed)
ANTICOAGULATION CONSULT NOTE - Follow Up Consult  Pharmacy Consult for heparin Indication:  dural venous sinus thrombosis  Labs: Recent Labs    01/06/23 0445 01/06/23 1426 01/06/23 2256  HGB 13.5  --   --   HCT 40.1  --   --   PLT 280  --   --   HEPARINUNFRC  --  0.11* 0.17*  CREATININE 0.53  --   --     Assessment: 32yo female subtherapeutic on heparin after rate change; no infusion issues or signs of bleeding per RN.  Goal of Therapy:  Heparin level 0.3-0.7 units/ml   Plan:  Increase heparin infusion by 4 units/kg/hr to 1400 units/hr. Check level in 6 hours.   Vernard Gambles, PharmD, BCPS 01/07/2023 12:36 AM

## 2023-01-07 NOTE — Progress Notes (Signed)
Patient is asking to shower.  I reached to MD (Dr. Oswaldo Done).  Patient has IV heparin infusing and per Dr. Oswaldo Done, patient may not shower until heparin drip has been discontinued.

## 2023-01-07 NOTE — Progress Notes (Signed)
PT Cancellation Note  Patient Details Name: [name] MRN: [id number] DOB: Jun 25, 1991   Cancelled Treatment:    Reason Eval/Treat Not Completed: PT screened, no needs identified, will sign off. Per OT pt functioning independently and has no acute PT needs at this time. Please re-consult if needed in future.  Lewis Shock, PT, DPT Acute Rehabilitation Services Secure chat preferred Office #: [phone]    Iona Hansen 01/07/2023, 1:28 PM

## 2023-01-07 NOTE — TOC Benefit Eligibility Note (Signed)
Pharmacy Patient Advocate Encounter  Insurance verification completed.    The patient is insured through [employer]   Ran test claim for Eliquis Starter Pack and the current 30 day co-pay is $4.00.   This test claim was processed through [facility]- copay amounts may vary at other pharmacies due to pharmacy/plan contracts, or as the patient moves through the different stages of their insurance plan.    Roland Earl, CPHT Pharmacy Patient Advocate Specialist [facility] Patient Advocate Team Direct Number: [phone]  Fax: [phone]

## 2023-01-07 NOTE — Progress Notes (Signed)
HD#1 SUBJECTIVE:  Patient Summary: [name] is a 32 y.o. with no significant PMH, who presented with headaches and admitted for a dural venous sinus thrombosis.   Overnight Events:  No acute overnight events   Interm History:  Patient laying in bed with boyfriend at bedside when entered room. Patient endorses continued headache. She reports that she has had headaches for the past 6 months but this feels worse. When asked about possible provoking factors for blood clots, she reports that she has had no travel over 1 hour, no recent surgeries, has been on no birth control, and has smoked 1 pack of cigarettes per day since she was 50-18. She has had no blood clots in the past that she is aware of. She says that her mother may have had blood clots in her legs but is unsure of other family hx since her mother was adopted and she does not talk to her father. We explained the importance of avoiding alcohol while on long term anticoagulation and she and boyfriend are confident that she could abstain from alcohol.   OBJECTIVE:  Vital Signs: Vitals:   01/07/23 0200 01/07/23 0400 01/07/23 0600 01/07/23 0917  BP:    103/69  Pulse:    77  Resp: 16 15 16 16   Temp:    98 F (36.7 C)  TempSrc:    Oral  SpO2: 100%   100%  Weight:      Height:       Supplemental O2: Room Air SpO2: 100 %  Filed Weights   01/06/23 0218 01/06/23 2049  Weight: 57.2 kg 58 kg     Intake/Output Summary (Last 24 hours) at 01/07/2023 1115 Last data filed at 01/07/2023 0700 Gross per 24 hour  Intake 1265.47 ml  Output --  Net 1265.47 ml   Net IO Since Admission: 1,265.47 mL [01/07/23 1115]  Physical Exam: Gen: alert, well appearing, in no acute distress HEENT: normocephalic, atraumatic Cranial Nerves: II: PERRL.  III,IV, VI: EOMI without ptosis or diplopia.  V: Patient endorses diminished sensation of L side of face. VII: Facial movement is symmetric.  VIII: hearing is intact to voice X: not  assessed XI: not assessed. XII: not assessed.  Motor: 5/5 strength was present in all four extremities.  Sensory: Sensation is symmetric to light touch in the arms and legs. Cerebellar: Finger-nose and Heel-shin are intact bilaterally    Patient Lines/Drains/Airways Status     Active Line/Drains/Airways     Name Placement date Placement time Site Days   Peripheral IV 01/06/23 20 G Anterior;Proximal;Left Forearm 01/06/23  0447  Forearm  1            Pertinent Labs:    Latest Ref Rng & Units 01/07/2023    6:19 AM 01/06/2023    4:45 AM 07/26/2022   12:18 AM  CBC  WBC 4.0 - 10.5 K/uL 14.5  9.9  9.1   Hemoglobin 12.0 - 15.0 g/dL 16.1  09.6  04.5   Hematocrit 36.0 - 46.0 % 36.0  40.1  41.2   Platelets 150 - 400 K/uL 267  280  409        Latest Ref Rng & Units 01/06/2023    4:45 AM 07/26/2022   12:18 AM 01/27/2020    6:34 PM  CMP  Glucose 70 - 99 mg/dL 90  409  811   BUN 6 - 20 mg/dL 8  7  9    Creatinine 0.44 - 1.00 mg/dL 9.14  0.58  0.50   Sodium 135 - 145 mmol/L 138  139  135   Potassium 3.5 - 5.1 mmol/L 3.3  3.6  3.3   Chloride 98 - 111 mmol/L 104  104  103   CO2 22 - 32 mmol/L 27  26  24    Calcium 8.9 - 10.3 mg/dL 8.6  8.9  8.9     No results for input(s): "GLUCAP" in the last 72 hours.   Pertinent Imaging: MR BRAIN WO CONTRAST  Result Date: 01/07/2023 CLINICAL DATA:  Provided history: Neuro deficit, acute, stroke suspected. EXAM: MRI HEAD WITHOUT CONTRAST TECHNIQUE: Multiplanar, multiecho pulse sequences of the brain and surrounding structures were obtained without intravenous contrast. COMPARISON:  CT of the head, maxillofacial structures and cervical spine 01/06/2023. CT venogram head 01/06/2023. FINDINGS: Brain: Cerebral volume is normal. No cortical encephalomalacia is identified. No significant cerebral white matter disease. There is no acute infarct. No evidence of an intracranial mass. No chronic intracranial blood products. No extra-axial fluid collection. No  midline shift. Vascular: Maintained flow voids within the proximal large arterial vessels. Known dural venous sinus thrombosis extending from the torcula to the left internal jugular vein, better delineated on the CT venogram head 01/06/2023. Skull and upper cervical spine: No focal suspicious marrow lesion. Sinuses/Orbits: No mass or acute finding within the imaged orbits. No significant paranasal sinus disease. IMPRESSION: 1. Unremarkable non-contrast MRI appearance of the brain. No evidence of an acute infarct. 2. Known dural venous sinus thrombosis extending from the torcula to the left internal jugular vein, better delineated on the CT venogram head of 01/06/2023. Electronically Signed   By: Jackey Loge D.O.   On: 01/07/2023 09:44   VAS [place] LOWER EXTREMITY VENOUS (DVT)  Result Date: 01/07/2023  Lower Venous DVT Study Patient Name:  [name]  Date of Exam:   01/06/2023 Medical Rec #: [id number]           Accession #:    [id number] Date of Birth: [birth year]           Patient Gender: F Patient Age:   31 years Exam Location:  [facility] Procedure:      VAS [place] LOWER EXTREMITY VENOUS (DVT) Referring Phys: Angelique Blonder WOLFE --------------------------------------------------------------------------------  Other Indications: Dural venous sinus thrombosis. Comparison Study: No prior studies. Performing Technologist: Jean Rosenthal RDMS, RVT  Examination Guidelines: A complete evaluation includes B-mode imaging, spectral Doppler, color Doppler, and power Doppler as needed of all accessible portions of each vessel. Bilateral testing is considered an integral part of a complete examination. Limited examinations for reoccurring indications may be performed as noted. The reflux portion of the exam is performed with the patient in reverse Trendelenburg.  +---------+---------------+---------+-----------+----------+--------------+ RIGHT    CompressibilityPhasicitySpontaneityPropertiesThrombus Aging  +---------+---------------+---------+-----------+----------+--------------+ CFV      Full           Yes      Yes                                 +---------+---------------+---------+-----------+----------+--------------+ SFJ      Full                                                        +---------+---------------+---------+-----------+----------+--------------+ FV Prox  Full                                                        +---------+---------------+---------+-----------+----------+--------------+  FV Mid   Full           Yes      Yes                                 +---------+---------------+---------+-----------+----------+--------------+ FV DistalFull                                                        +---------+---------------+---------+-----------+----------+--------------+ PFV      Full                                                        +---------+---------------+---------+-----------+----------+--------------+ POP      Full           Yes      Yes                                 +---------+---------------+---------+-----------+----------+--------------+ PTV      Full                                                        +---------+---------------+---------+-----------+----------+--------------+ PERO     Full                                                        +---------+---------------+---------+-----------+----------+--------------+   +---------+---------------+---------+-----------+----------+--------------+ LEFT     CompressibilityPhasicitySpontaneityPropertiesThrombus Aging +---------+---------------+---------+-----------+----------+--------------+ CFV      Full           Yes      Yes                                 +---------+---------------+---------+-----------+----------+--------------+ SFJ      Full                                                         +---------+---------------+---------+-----------+----------+--------------+ FV Prox  Full                                                        +---------+---------------+---------+-----------+----------+--------------+ FV Mid   Full           Yes      Yes                                 +---------+---------------+---------+-----------+----------+--------------+  FV DistalFull                                                        +---------+---------------+---------+-----------+----------+--------------+ PFV      Full                                                        +---------+---------------+---------+-----------+----------+--------------+ POP      Full           Yes      Yes                                 +---------+---------------+---------+-----------+----------+--------------+ PTV      Full                                                        +---------+---------------+---------+-----------+----------+--------------+ PERO     Full                                                        +---------+---------------+---------+-----------+----------+--------------+     Summary: RIGHT: - There is no evidence of deep vein thrombosis in the lower extremity.  - No cystic structure found in the popliteal fossa.  LEFT: - There is no evidence of deep vein thrombosis in the lower extremity.  - No cystic structure found in the popliteal fossa.  *See table(s) above for measurements and observations. Electronically signed by Gerarda Fraction on 01/07/2023 at 7:34:43 AM.    Final     ASSESSMENT/PLAN:  Assessment: Principal Problem:   Dural venous sinus thrombosis Active Problems:   Hypokalemia   Substance use disorder   Menorrhagia   [name] is a 32 y.o. with no significant PMH, who presented with headaches and admitted for a dural venous sinus thrombosis.  Plan: #Dural Venous Sinus Thrombosis CT venogram a dural venous sinus thrombosis from torcula  to the left internal jugular. No brain edema or hemorrhage. MRI showed no infarcts. Patient has an echo scheduled for today to rule out shunts. Patient has had headaches for the past 6 months and also hit her head this past Friday. DVST could have been provoked by smoking and/or alcohol use, but no other provoking factors identified. DVST has likely been present for a while given hx of headaches but also could have been related to her recent head trauma. ATIII and ANA WNL, other hypercoagulation labs in process. Continue heparin 1400 units/hour and maintain heparin level of 0.3-0.7. Plan to discharge on Eliquis 5 mg BID for at least 3 months. Spoke about safety and avoiding alcohol with on anticoagulation and will re-counsel before discharge.  -IV Heparin 1400 units/hour -Monitor neurological status -Likely discharge tomorrow on Eliquis 5 mg BID -Follow up on hypercoagulation labs when resulted -Continue Tylenol  PRN for pain control  #Menorrhagia Patient has reported hx of menorrhagia, which could be worsened by anticoagulation. Patient has contraindication for estrogen containing products. Can consider progestin pills NETA 5 mg daily, or LNG 52 IUD. Will educate patient on options before discharge and use shared decision making to tx menorrhagia outpatient. -Educate/provide resources for txs for menorrhagia -can start tx before discharge or outpatient  #Substance/Alcohol Use Counseled on importance of avoiding alcohol with on DOAC. She feels confident she can abstain from alcohol. Can provide resources if patient desires for quitting alcohol/substance use.  Best Practice: Diet: Regular diet IVF: Fluids: none, Rate: None VTE: Heparin 1400 units/hour Code: Full AB: None Therapy Recs: None, DME: none DISPO: Anticipated discharge tomorrow to Home pending  anticoagulation with heparin and stable neurological status, cleared by neurology .  Signature: Randell Patient, Medical Student   Please  contact the on call pager after 5 pm and on weekends at [phone].

## 2023-01-07 NOTE — Progress Notes (Addendum)
ANTICOAGULATION CONSULT NOTE - Follow Up Consult  Pharmacy Consult for Heparin Indication:  Sinus thrombosis  Allergies  Allergen Reactions   Gentamicin Rash   Penicillins Rash    Patient Measurements: Height: 5\' 3"  (160 cm) Weight: 58 kg (127 lb 13.9 oz) IBW/kg (Calculated) : 52.4 Heparin Dosing Weight: 57.2 kg  Vital Signs: Temp: 97.6 F (36.4 C) (07/08 2345) Temp Source: Oral (07/08 2345) BP: 139/91 (07/08 2345) Pulse Rate: 115 (07/08 2345)  Labs: Recent Labs    01/06/23 0445 01/06/23 1426 01/06/23 2256 01/07/23 0622  HGB 13.5  --   --   --   HCT 40.1  --   --   --   PLT 280  --   --   --   HEPARINUNFRC  --  0.11* 0.17* 0.35  CREATININE 0.53  --   --   --      Estimated Creatinine Clearance: 83.5 mL/min (by C-G formula based on SCr of 0.53 mg/dL).  Assessment: 34 YOF presented with headache, no evidence of anticoagulation PTA. CT head significant for dural venous sinus thrombosis.  7/9 AM HL therapeutic at 0.35 with heparin running at 1400 units/hour. No signs of bleeding or issues with the heparin infusion noted.   Goal of Therapy:  Heparin level 0.3-0.7 units/ml Monitor platelets by anticoagulation protocol: Yes  ADDENDUM: Confirmatory heparin level remains therapeutic at 0.44 with heparin running at 1400 units/hour. CBC WNL.   PLAN: Continue Heparin infusion at 1400 units/hour  Monitor heparin level and CBC daily  Monitor for Signs and Symptoms of bleeding   Jani Gravel, PharmD Clinical Pharmacist  01/07/2023 7:06 AM

## 2023-01-07 NOTE — Evaluation (Signed)
Occupational Therapy Evaluation Patient Details Name: [name] MRN: [id number] DOB: 1991/03/12 Today's Date: 01/07/2023   History of Present Illness 32 yo female struck head s/p fall (+) concussion seen at [facility] and discharged. Pt admitted [facility] with scans revealing sinus thrombosis pmh polysubstance abuse, concussion, ETOH use, tobacco use   Clinical Impression   Patient evaluated by Occupational Therapy with no further acute OT needs identified. All education has been completed and the patient has no further questions. See below for any follow-up Occupational Therapy or equipment needs. OT to sign off. Thank you for referral.   *pt mentioned having LLE behind the knee edema prior to admission with recent blood vessel bursting. Pt today reports LLE knee area to be "normal"  per patient report.     Recommendations for follow up therapy are one component of a multi-disciplinary discharge planning process, led by the attending physician.  Recommendations may be updated based on patient status, additional functional criteria and insurance authorization.   Assistance Recommended at Discharge None  Patient can return home with the following      Functional Status Assessment  Patient has had a recent decline in their functional status and demonstrates the ability to make significant improvements in function in a reasonable and predictable amount of time.  Equipment Recommendations  None recommended by OT    Recommendations for Other Services       Precautions / Restrictions Precautions Precautions: Fall      Mobility Bed Mobility Overal bed mobility: Independent                  Transfers Overall transfer level: Independent                        Balance Overall balance assessment: Modified Independent                                         ADL either performed or assessed with clinical judgement   ADL Overall ADL's : Independent                                        General ADL Comments: demonstrates LB dressing, bathroom transfer managment of the IV pole, transfer to sink level , self feeding, use of phone and static standing with vision occlusion to simulate bathing. pt educated on seat position for safety and mangement of activity for energy conservation. pt and mother present entire session. pt very focused on showering and pending abilty to shower. pt again educated that lack of order and IV heparin prevents showering     Vision   Additional Comments: reports pain with looking up toward the ceiling. pt reports dizziness with nodding motion ( neck flexion / extension) and reports light sensitivity     Perception     Praxis      Pertinent Vitals/Pain Pain Assessment Pain Assessment: No/denies pain     Hand Dominance Right   Extremity/Trunk Assessment Upper Extremity Assessment Upper Extremity Assessment: Overall WFL for tasks assessed   Lower Extremity Assessment Lower Extremity Assessment: Overall WFL for tasks assessed;LLE deficits/detail LLE Deficits / Details: reports edema in legs sometimes but denies any swelling now   Cervical / Trunk Assessment Cervical / Trunk Assessment: Normal   Communication Communication Communication:  No difficulties   Cognition Arousal/Alertness: Awake/alert Behavior During Therapy: WFL for tasks assessed/performed Overall Cognitive Status: Within Functional Limits for tasks assessed                                 General Comments: pt eager to take shower and repeating the desire to shower.     General Comments       Exercises     Shoulder Instructions      Home Living Family/patient expects to be discharged to:: Private residence Living Arrangements: Spouse/significant other Available Help at Discharge: Family Type of Home: House       Home Layout: One level     Bathroom Shower/Tub: Chief Strategy Officer:  Standard     Home Equipment: None      Lives With: Significant other    Prior Functioning/Environment Prior Level of Function : Independent/Modified Independent                        OT Problem List:        OT Treatment/Interventions:      OT Goals(Current goals can be found in the care plan section) Acute Rehab OT Goals Patient Stated Goal: to be able to shower Potential to Achieve Goals: Good  OT Frequency:      Co-evaluation              AM-PAC OT "6 Clicks" Daily Activity     Outcome Measure Help from another person eating meals?: None Help from another person taking care of personal grooming?: None Help from another person toileting, which includes using toliet, bedpan, or urinal?: None Help from another person bathing (including washing, rinsing, drying)?: None Help from another person to put on and taking off regular upper body clothing?: None Help from another person to put on and taking off regular lower body clothing?: None 6 Click Score: 24   End of Session Nurse Communication: Mobility status;Precautions  Activity Tolerance: Patient tolerated treatment well Patient left: in bed;with call bell/phone within reach;with family/visitor present  OT Visit Diagnosis: Unsteadiness on feet (R26.81)                Time: 6213-0865 OT Time Calculation (min): 22 min Charges:  OT General Charges $OT Visit: 1 Visit OT Evaluation $OT Eval Low Complexity: 1 Low   [name], OTR/L  Acute Rehabilitation Services Office: [phone] .   Mateo Flow 01/07/2023, 12:52 PM

## 2023-01-07 NOTE — Evaluation (Signed)
Speech Language Pathology Evaluation Patient Details Name: [name] MRN: [id number] DOB: 1991/06/25 Today's Date: 01/07/2023 Time: 4098-1191 SLP Time Calculation (min) (ACUTE ONLY): 19 min  Problem List:  Patient Active Problem List   Diagnosis Date Noted   Dural venous sinus thrombosis 01/06/2023   Hypokalemia 01/06/2023   Substance use disorder 01/06/2023   Menorrhagia 01/06/2023   Past Medical History:  Past Medical History:  Diagnosis Date   Alcohol abuse    Cerebral venous sinus thrombosis 01/06/2023   Chronic headache    Cocaine abuse (HCC)    Concussion    Marijuana abuse    Nicotine dependence    Opioid use disorder    Polysubstance abuse (HCC)    Smoker    Past Surgical History:  Past Surgical History:  Procedure Laterality Date   APPENDECTOMY     HPI:  Pt is a 32 yo female presenting to ED 7/8 with worsening headache after falling 7/6 and sustaining a concussion. CTV Head concerning for dural venous sinus thrombosis. PMH includes polysubstance abuse, tobacco abuse, ruptured appendix s/p appendectomy   Assessment / Plan / Recommendation Clinical Impression  Pt reports no acute differences in cognition or speech. She states that she typically handles all finances and medications independently. She scored 25/30 on the SLUMS, which is indicative of a mild impairment (27/30 is representative of no impairment). Pt reports feeling like this evaluation was no more difficult than PTA and she has no acute concerns. SLP provided education regarding importance of having increased assistance PRN following discharge for complex cognitive tasks. No further SLP f/u is necessary, with which pt agrees.    SLP Assessment  SLP Recommendation/Assessment: Patient does not need any further Speech Lanaguage Pathology Services SLP Visit Diagnosis: Cognitive communication deficit (R41.841)    Recommendations for follow up therapy are one component of a multi-disciplinary  discharge planning process, led by the attending physician.  Recommendations may be updated based on patient status, additional functional criteria and insurance authorization.    Follow Up Recommendations  No SLP follow up    Assistance Recommended at Discharge  PRN  Functional Status Assessment Patient has not had a recent decline in their functional status  Frequency and Duration           SLP Evaluation Cognition  Overall Cognitive Status: Within Functional Limits for tasks assessed Arousal/Alertness: Awake/alert Orientation Level: Oriented X4 Attention: Selective Selective Attention: Appears intact Memory: Appears intact Awareness: Appears intact Problem Solving: Appears intact       Comprehension  Auditory Comprehension Overall Auditory Comprehension: Appears within functional limits for tasks assessed Visual Recognition/Discrimination Discrimination: Not tested Reading Comprehension Reading Status: Not tested    Expression Expression Primary Mode of Expression: Verbal Verbal Expression Overall Verbal Expression: Appears within functional limits for tasks assessed   Oral / Motor  Oral Motor/Sensory Function Overall Oral Motor/Sensory Function: Within functional limits Motor Speech Overall Motor Speech: Appears within functional limits for tasks assessed            Gwynneth Aliment, M.A., CF-SLP Speech Language Pathology, Acute Rehabilitation Services  Secure Chat preferred [phone]  01/07/2023, 10:40 AM

## 2023-01-07 NOTE — Hospital Course (Signed)
[name] is a 32 yo female with no relevant PMH who presented to the ED on 01/06/23 with headaches and found to have a dural venous sinus thrombosis.  #Dural Venous Sinus Thrombosis Patient hit her head after a fall last Friday and had a persistent headache. She says she has had headaches for the past 6 months. CT head, maxillofacial, cervical spine, and venogram showed a dural venous sinus thrombosis from the torcula to the L internal jugular with no other abnormalities. MR brain showed no infarcts. She was given IV heparin 1400 units/hour. For pain control throughout stay she was given tylenol, one dose of dilaudid, and one dose of Toradol. She has had no neurologic deficits other than some L sided facial numbness that has improved. Stable for discharge today 01/08/23. Will discharge on Eliquis 10 mg BID for 7 days and then 5 mg BID for 3 months. Counseled on the importance of limiting risk factors for falls/bleeding such as alcohol and substance use.  #Iron Deficiency #Homocystinemia  Iron studies show iron of 17, Hgb is 11.7. Gave 250 mg IV iron. Iron deficiency likely due to menorrhagia. Homocystine elevated to 54.8 likely from B6/B12/folate deficiency in the setting of alcohol overuse. Prescribing oral iron, B6, B12, folic acid supplements for discharge.  #Menorrhagia Patient has hx of menorrhagia and has been on no hormonal therapy. Long term Eliquis use will likely worsen bleeding so educated patient on possible treatments for menorrhagia. Patient preferred oral progestin pills. Start NETA 5 mg daily. Evaluate symptoms outpatient and adjust dose as needed.  #Alcohol Use Patient has consumed alcohol daily for many years. Counseled about the risks of alcohol while on Eliquis. She feels confident that she can abstain. Offered naloxone to aid with alcohol cessation and she is agreeable.

## 2023-01-07 NOTE — Progress Notes (Addendum)
STROKE TEAM PROGRESS NOTE   INTERVAL HISTORY Her family is at the bedside.  She is laying in bed in no apparent distress. She c/o of headache and slight numbness on left arm. Still on heparin gtt  Mri brain with no acute process   Vitals:   01/07/23 0400 01/07/23 0600 01/07/23 0917 01/07/23 1117  BP:   103/69 104/65  Pulse:   77 68  Resp: 15 16 16 16   Temp:   98 F (36.7 C) 98.1 F (36.7 C)  TempSrc:   Oral Oral  SpO2:   100% 100%  Weight:      Height:       CBC:  Recent Labs  Lab 01/06/23 0445 01/07/23 0619  WBC 9.9 14.5*  NEUTROABS 6.2  --   HGB 13.5 12.1  HCT 40.1 36.0  MCV 101.0* 102.0*  PLT 280 267    Basic Metabolic Panel:  Recent Labs  Lab 01/06/23 0445 01/06/23 0542  NA 138  --   K 3.3*  --   CL 104  --   CO2 27  --   GLUCOSE 90  --   BUN 8  --   CREATININE 0.53  --   CALCIUM 8.6*  --   MG  --  1.9    Lipid Panel:  Recent Labs  Lab 01/07/23 0622  CHOL 117  TRIG 96  HDL 40*  CHOLHDL 2.9  VLDL 19  LDLCALC 58   HgbA1c:  Recent Labs  Lab 01/06/23 1016  HGBA1C 5.2   Urine Drug Screen:  Recent Labs  Lab 01/06/23 0855  LABOPIA POSITIVE*  COCAINSCRNUR POSITIVE*  LABBENZ POSITIVE*  AMPHETMU NONE DETECTED  THCU POSITIVE*  LABBARB NONE DETECTED     Alcohol Level No results for input(s): "ETH" in the last 168 hours.  IMAGING past 24 hours MR BRAIN WO CONTRAST  Result Date: 01/07/2023 CLINICAL DATA:  Provided history: Neuro deficit, acute, stroke suspected. EXAM: MRI HEAD WITHOUT CONTRAST TECHNIQUE: Multiplanar, multiecho pulse sequences of the brain and surrounding structures were obtained without intravenous contrast. COMPARISON:  CT of the head, maxillofacial structures and cervical spine 01/06/2023. CT venogram head 01/06/2023. FINDINGS: Brain: Cerebral volume is normal. No cortical encephalomalacia is identified. No significant cerebral white matter disease. There is no acute infarct. No evidence of an intracranial mass. No chronic  intracranial blood products. No extra-axial fluid collection. No midline shift. Vascular: Maintained flow voids within the proximal large arterial vessels. Known dural venous sinus thrombosis extending from the torcula to the left internal jugular vein, better delineated on the CT venogram head 01/06/2023. Skull and upper cervical spine: No focal suspicious marrow lesion. Sinuses/Orbits: No mass or acute finding within the imaged orbits. No significant paranasal sinus disease. IMPRESSION: 1. Unremarkable non-contrast MRI appearance of the brain. No evidence of an acute infarct. 2. Known dural venous sinus thrombosis extending from the torcula to the left internal jugular vein, better delineated on the CT venogram head of 01/06/2023. Electronically Signed   By: Jackey Loge D.O.   On: 01/07/2023 09:44   VAS [place] LOWER EXTREMITY VENOUS (DVT)  Result Date: 01/07/2023  Lower Venous DVT Study Patient Name:  [name]  Date of Exam:   01/06/2023 Medical Rec #: [id number]           Accession #:    [id number] Date of Birth: 17-Mar-1991           Patient Gender: F Patient Age:   32 years Exam Location:  [facility] Procedure:      VAS [place] LOWER EXTREMITY VENOUS (DVT) Referring Phys: DENISE WOLFE --------------------------------------------------------------------------------  Other Indications: Dural venous sinus thrombosis. Comparison Study: No prior studies. Performing Technologist: Jean Rosenthal RDMS, RVT  Examination Guidelines: A complete evaluation includes B-mode imaging, spectral Doppler, color Doppler, and power Doppler as needed of all accessible portions of each vessel. Bilateral testing is considered an integral part of a complete examination. Limited examinations for reoccurring indications may be performed as noted. The reflux portion of the exam is performed with the patient in reverse Trendelenburg.  +---------+---------------+---------+-----------+----------+--------------+ RIGHT     CompressibilityPhasicitySpontaneityPropertiesThrombus Aging +---------+---------------+---------+-----------+----------+--------------+ CFV      Full           Yes      Yes                                 +---------+---------------+---------+-----------+----------+--------------+ SFJ      Full                                                        +---------+---------------+---------+-----------+----------+--------------+ FV Prox  Full                                                        +---------+---------------+---------+-----------+----------+--------------+ FV Mid   Full           Yes      Yes                                 +---------+---------------+---------+-----------+----------+--------------+ FV DistalFull                                                        +---------+---------------+---------+-----------+----------+--------------+ PFV      Full                                                        +---------+---------------+---------+-----------+----------+--------------+ POP      Full           Yes      Yes                                 +---------+---------------+---------+-----------+----------+--------------+ PTV      Full                                                        +---------+---------------+---------+-----------+----------+--------------+ PERO     Full                                                        +---------+---------------+---------+-----------+----------+--------------+   +---------+---------------+---------+-----------+----------+--------------+  LEFT     CompressibilityPhasicitySpontaneityPropertiesThrombus Aging +---------+---------------+---------+-----------+----------+--------------+ CFV      Full           Yes      Yes                                 +---------+---------------+---------+-----------+----------+--------------+ SFJ      Full                                                         +---------+---------------+---------+-----------+----------+--------------+ FV Prox  Full                                                        +---------+---------------+---------+-----------+----------+--------------+ FV Mid   Full           Yes      Yes                                 +---------+---------------+---------+-----------+----------+--------------+ FV DistalFull                                                        +---------+---------------+---------+-----------+----------+--------------+ PFV      Full                                                        +---------+---------------+---------+-----------+----------+--------------+ POP      Full           Yes      Yes                                 +---------+---------------+---------+-----------+----------+--------------+ PTV      Full                                                        +---------+---------------+---------+-----------+----------+--------------+ PERO     Full                                                        +---------+---------------+---------+-----------+----------+--------------+     Summary: RIGHT: - There is no evidence of deep vein thrombosis in the lower extremity.  - No cystic structure found in the popliteal fossa.  LEFT: - There is no evidence of deep vein thrombosis in the lower extremity.  - No cystic structure found in the popliteal  fossa.  *See table(s) above for measurements and observations. Electronically signed by Gerarda Fraction on 01/07/2023 at 7:34:43 AM.    Final     PHYSICAL EXAM  Temp:  [97.6 F (36.4 C)-98.3 F (36.8 C)] 98.1 F (36.7 C) (07/09 1117) Pulse Rate:  [68-115] 68 (07/09 1117) Resp:  [15-23] 16 (07/09 1117) BP: (94-139)/(56-91) 104/65 (07/09 1117) SpO2:  [99 %-100 %] 100 % (07/09 1117) Weight:  [58 kg] 58 kg (07/08 2049)  General - Well nourished, well developed, in no apparent distress. Cardiovascular - Regular  rhythm and rate.  Mental Status -  Level of arousal and orientation to time, place, and person were intact. Language including expression, naming, repetition, comprehension was assessed and found intact. Attention span and concentration were normal. Recent and remote memory were intact. Fund of Knowledge was assessed and was intact.  Cranial Nerves II - XII - II - Visual field intact OU. III, IV, VI - Extraocular movements intact. V -crease sensation on left face VII - Facial movement intact bilaterally. VIII - Hearing & vestibular intact bilaterally. X - Palate elevates symmetrically. XI - Chin turning & shoulder shrug intact bilaterally. XII - Tongue protrusion intact.  Motor Strength - The patient's strength was normal in all extremities and pronator drift was absent.  Bulk was normal and fasciculations were absent.   Motor Tone - Muscle tone was assessed at the neck and appendages and was normal. Sensory - Light touch, temperature/pinprick were assessed and were symmetrical.    Coordination - The patient had normal movements in the hands and feet with no ataxia or dysmetria.  Tremor was absent.  Gait and Station - deferred.  ASSESSMENT/PLAN [name] is a 32 y.o. 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 No brain edema or  hemorrhage. MRI No acute process Carotid Doppler negative 2D Echo EF 60 to 65% [place] LE negative for DVT UDS positive for opiates, cocaine, benzos, THC Hypercoagulable panel pending, homocystine 54.8 ANA negative LDL 58 HgbA1c 5.2 VTE prophylaxis -heparin IV No antithrombotics prior to admission, now on heparin IV.  If stable, consider switching to Eliquis in a.m. 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 Pt was seen and examined.   No family at bedside.  Patient sitting at the side of bed for lunch.  Neuro intact.  MRI no acute finding except Known CVST.  Homocystine level 54.8, likely due to smoking, but potentially related CVST.  Will add B12, B6 and folic acid.  On heparin IV, will switch to Eliquis in a.m. if tolerating well.  Again educated on quit smoking, THC for cocaine.  Limiting alcohol use.  For detailed assessment and plan, please refer to above/below as I have made changes wherever appropriate.   Marvel Plan, MD PhD Stroke Neurology 01/07/2023 5:09 PM   To contact Stroke Continuity provider, please refer to [email or web address]. After hours, contact General Neurology

## 2023-01-08 ENCOUNTER — Other Ambulatory Visit (HOSPITAL_COMMUNITY): Payer: Self-pay

## 2023-01-08 DIAGNOSIS — E7211 Homocystinuria: Secondary | ICD-10-CM | POA: Diagnosis not present

## 2023-01-08 DIAGNOSIS — N920 Excessive and frequent menstruation with regular cycle: Secondary | ICD-10-CM | POA: Diagnosis not present

## 2023-01-08 DIAGNOSIS — G08 Intracranial and intraspinal phlebitis and thrombophlebitis: Secondary | ICD-10-CM | POA: Diagnosis not present

## 2023-01-08 LAB — CBC
HCT: 35.4 % — ABNORMAL LOW (ref 36.0–46.0)
Hemoglobin: 11.7 g/dL — ABNORMAL LOW (ref 12.0–15.0)
MCH: 33.1 pg (ref 26.0–34.0)
MCHC: 33.1 g/dL (ref 30.0–36.0)
MCV: 100 fL (ref 80.0–100.0)
Platelets: 264 10*3/uL (ref 150–400)
RBC: 3.54 MIL/uL — ABNORMAL LOW (ref 3.87–5.11)
RDW: 15.7 % — ABNORMAL HIGH (ref 11.5–15.5)
WBC: 9.7 10*3/uL (ref 4.0–10.5)
nRBC: 0 % (ref 0.0–0.2)

## 2023-01-08 LAB — PROTEIN C, TOTAL: Protein C, Total: 71 % (ref 60–150)

## 2023-01-08 LAB — LUPUS ANTICOAGULANT PANEL
DRVVT: 34.2 s (ref 0.0–47.0)
PTT Lupus Anticoagulant: 36 s (ref 0.0–43.5)

## 2023-01-08 LAB — PROTEIN C ACTIVITY: Protein C Activity: 69 % — ABNORMAL LOW (ref 73–180)

## 2023-01-08 LAB — PROTEIN S ACTIVITY: Protein S Activity: 82 % (ref 63–140)

## 2023-01-08 LAB — HEPARIN LEVEL (UNFRACTIONATED): Heparin Unfractionated: 0.69 IU/mL (ref 0.30–0.70)

## 2023-01-08 LAB — CARDIOLIPIN ANTIBODIES, IGG, IGM, IGA
Anticardiolipin IgA: <9 APL U/mL (ref 0–11)
Anticardiolipin IgG: <9 GPL U/mL (ref 0–14)
Anticardiolipin IgM: <9 MPL U/mL (ref 0–12)

## 2023-01-08 LAB — PROTEIN S, TOTAL: Protein S Ag, Total: 76 % (ref 60–150)

## 2023-01-08 MED ORDER — APIXABAN 5 MG PO TABS
5.0000 mg | ORAL_TABLET | Freq: Two times a day (BID) | ORAL | 0 refills | Status: DC
  Filled 2023-01-08: qty 180, 90d supply, fill #0

## 2023-01-08 MED ORDER — FERROUS SULFATE 325 (65 FE) MG PO TABS
325.0000 mg | ORAL_TABLET | Freq: Every day | ORAL | 0 refills | Status: AC
  Filled 2023-01-08: qty 100, 100d supply, fill #0

## 2023-01-08 MED ORDER — APIXABAN 5 MG PO TABS
10.0000 mg | ORAL_TABLET | Freq: Two times a day (BID) | ORAL | Status: DC
  Administered 2023-01-08: 10 mg via ORAL
  Filled 2023-01-08: qty 2

## 2023-01-08 MED ORDER — NALTREXONE HCL 50 MG PO TABS
50.0000 mg | ORAL_TABLET | Freq: Every day | ORAL | 0 refills | Status: AC
  Filled 2023-01-08: qty 30, 30d supply, fill #0

## 2023-01-08 MED ORDER — PYRIDOXINE HCL 50 MG PO TABS
50.0000 mg | ORAL_TABLET | Freq: Every day | ORAL | 0 refills | Status: AC
  Filled 2023-01-08: qty 100, 100d supply, fill #0

## 2023-01-08 MED ORDER — NORETHINDRONE ACETATE 5 MG PO TABS
5.0000 mg | ORAL_TABLET | Freq: Every day | ORAL | 0 refills | Status: DC
  Filled 2023-01-08: qty 20, 20d supply, fill #0
  Filled 2023-06-22: qty 10, 10d supply, fill #0

## 2023-01-08 MED ORDER — APIXABAN 5 MG PO TABS: 5.0000 mg | ORAL_TABLET | Freq: Two times a day (BID) | ORAL | Status: DC

## 2023-01-08 MED ORDER — FOLIC ACID 1 MG PO TABS
1.0000 mg | ORAL_TABLET | Freq: Every day | ORAL | 0 refills | Status: AC
  Filled 2023-01-08: qty 30, 30d supply, fill #0

## 2023-01-08 MED ORDER — CYANOCOBALAMIN 1000 MCG PO TABS
1000.0000 ug | ORAL_TABLET | Freq: Every day | ORAL | 0 refills | Status: AC
  Filled 2023-01-08: qty 130, 130d supply, fill #0

## 2023-01-08 MED ORDER — APIXABAN 5 MG PO TABS
10.0000 mg | ORAL_TABLET | Freq: Two times a day (BID) | ORAL | 0 refills | Status: DC
  Filled 2023-01-08: qty 70, 30d supply, fill #0

## 2023-01-08 NOTE — Progress Notes (Signed)
Mobility Specialist Progress Note:    01/08/23 1651  Mobility  Activity Ambulated independently in hallway  Level of Assistance Modified independent, requires aide device or extra time  Assistive Device None  Distance Ambulated (ft) 500 ft  Activity Response Tolerated well  Mobility Referral Yes  $Mobility charge 1 Mobility  Mobility Specialist Start Time (ACUTE ONLY) 1635  Mobility Specialist Stop Time (ACUTE ONLY) 1645  Mobility Specialist Time Calculation (min) (ACUTE ONLY) 10 min   Received pt in bed having no complaints and agreeable to mobility. Pt was asymptomatic throughout ambulation and returned to room w/o fault. Left seated EOB w/ call bell in reach and all needs met.   Thompson Grayer Mobility Specialist  Please contact vis Secure Chat or  Rehab Office [phone]

## 2023-01-08 NOTE — Progress Notes (Addendum)
ANTICOAGULATION CONSULT NOTE - Follow Up Consult  Pharmacy Consult for Heparin Indication:  Sinus thrombosis  Allergies  Allergen Reactions   Gentamicin Rash   Penicillins Rash    Patient Measurements: Height: 5\' 3"  (160 cm) Weight: 58 kg (127 lb 13.9 oz) IBW/kg (Calculated) : 52.4 Heparin Dosing Weight: 57.2 kg  Vital Signs: Temp: 97.7 F (36.5 C) (07/10 0441) Temp Source: Oral (07/10 0441) BP: 98/66 (07/10 1610) Pulse Rate: 58 (07/10 0633)  Labs: Recent Labs    01/06/23 0445 01/06/23 1426 01/07/23 0619 01/07/23 0622 01/07/23 1233 01/08/23 0245  HGB 13.5  --  12.1  --   --  11.7*  HCT 40.1  --  36.0  --   --  35.4*  PLT 280  --  267  --   --  264  HEPARINUNFRC  --    < >  --  0.35 0.44 0.69  CREATININE 0.53  --   --   --   --   --    < > = values in this interval not displayed.     Estimated Creatinine Clearance: 83.5 mL/min (by C-G formula based on SCr of 0.53 mg/dL).  Assessment: 37 YOF presented with headache, no evidence of anticoagulation PTA. CT head significant for dural venous sinus thrombosis.  7/10 AM HL therapeutic at 0.69 with heparin running at 1400 units/hour. No signs of bleeding or issues with the heparin infusion noted. Will decrease rate slightly as heparin level is creeping up.   Goal of Therapy:  Heparin level 0.3-0.7 units/ml Monitor platelets by anticoagulation protocol: Yes  ADDENDUM: Confirmatory heparin level remains therapeutic at 0.44 with heparin running at 1400 units/hour. CBC WNL.   PLAN: Decrease Heparin infusion to 1350 units/hour  Monitor heparin level and CBC daily  Monitor for Signs and Symptoms of bleeding  IM team following up with Neuro for possible transition to Eliquis (co-pay for starter pack $4)   ADDENDUM: Stop heparin infusion at 1200  Start apixaban 10 mg BID x7 days followed by 5 mg BID   Jani Gravel, PharmD Clinical Pharmacist  01/08/2023 7:14 AM

## 2023-01-08 NOTE — Discharge Summary (Addendum)
Name: [name] MRN: [id number] DOB: 1990/12/05 32 y.o. PCP: Chrys Racer, MD  Date of Admission: 01/06/2023  2:12 AM Date of Discharge: 01/08/2023 Attending Physician: Dr. Oswaldo Done  Discharge Diagnosis: Principal Problem:   Dural venous sinus thrombosis Active Problems:   Hypokalemia   Substance use disorder   Menorrhagia    Discharge Medications: Allergies as of 01/08/2023       Reactions   Gentamicin Rash   Penicillins Rash        Medication List     STOP taking these medications    meloxicam 15 MG tablet Commonly known as: Mobic       TAKE these medications    cyanocobalamin 1000 MCG tablet Commonly known as: VITAMIN B12 Take 1 tablet (1000 mcg total) by mouth daily.   Eliquis 5 MG Tabs tablet Generic drug: apixaban Take 2 tablets (10 mg total) by mouth 2 (two) times daily for 6 days. Then take 1 tablet (5mg ) twice daily.   apixaban 5 MG Tabs tablet Commonly known as: ELIQUIS Take 1 tablet (5 mg total) by mouth 2 (two) times daily. Start taking on: January 15, 2023   FeroSul 325 (65 Fe) MG tablet Generic drug: ferrous sulfate Take 1 tablet (325 mg total) by mouth daily with breakfast.   folic acid 1 MG tablet Commonly known as: FOLVITE Take 1 tablet (1 mg total) by mouth daily.   naltrexone 50 MG tablet Commonly known as: DEPADE Take 1 tablet (50 mg total) by mouth daily.   norethindrone 5 MG tablet Commonly known as: Aygestin Take 1 tablet (5 mg total) by mouth daily.   ondansetron 4 MG disintegrating tablet Commonly known as: ZOFRAN-ODT Take 1 tablet (4 mg total) by mouth every 8 (eight) hours as needed for nausea.   Vitamin B6 50 MG Tabs Take 1 tablet (50 mg total) by mouth daily.        Disposition and follow-up:   Ms.Denni L Perazzo was discharged from [facility] in Stable condition.  At the hospital follow up visit please address:  1.  Follow-up:  a. DVST: Ensure adherence with eliquis. Evaluate  headache and neurologic symptoms.    b. Alcohol use: Ensure compliance with naltrexone and alcohol avoidance.   c. Menorrhagia: Evaluate compliance with progestin pills and symptoms. Consider increasing dose if excessive bleeding is persistent.   d. Iron deficiency/homocystinemia: Ensure compliance with iron supplement, B6, B12, folate and follow up CBC for anemia.  2.  Labs / imaging needed at time of follow-up: CBC  3.  Pending labs/ test needing follow-up: None  4.  Medication Changes  Started: Eliquis, naltrexone, progestin, iron supplement, B6, B12, folic acid  Stopped: meloxicam  Follow-up Appointments:  Follow-up Information     Chrys Racer, MD. Schedule an appointment as soon as possible for a visit today.   Specialty: Internal Medicine Why: Call to make an appointment with your PCP in 7-10 days. Contact information: [address] [place] [address] [phone]         [facility]. Schedule an appointment as soon as possible for a visit in 1 month(s).   Specialty: Neurology Why: stroke clinic Contact information: [address] [place] [address] [phone]                Hospital Course by problem list:  [name] is a 32 yo female with no relevant PMH who presented to the ED on 01/06/23 with headaches  and found to have a dural venous sinus thrombosis.  #Dural Venous Sinus Thrombosis Patient hit her head after a fall last Friday and had a persistent headache. She says she has had headaches for the past 6 months. CT head, maxillofacial, cervical spine, and venogram showed a dural venous sinus thrombosis from the torcula to the L internal jugular with no other abnormalities. MR brain showed no infarcts. She was given IV heparin 1400 units/hour. For pain control throughout stay she was given tylenol, one dose of dilaudid, and one dose of Toradol. She has had no neurologic deficits  other than some L sided facial numbness that has improved. Stable for discharge today 01/08/23. Will discharge on Eliquis 10 mg BID for 7 days and then 5 mg BID for 3 months. Counseled on the importance of limiting risk factors for falls/bleeding such as alcohol and substance use.  #Iron Deficiency #Homocystinemia  Iron studies show iron of 17, Hgb is 11.7. Gave 250 mg IV iron. Iron deficiency likely due to menorrhagia. Homocystine elevated to 54.8 likely from B6/B12/folate deficiency in the setting of alcohol overuse. Prescribing oral iron, B6, B12, folic acid supplements for discharge.  #Menorrhagia Patient has hx of menorrhagia and has been on no hormonal therapy. Long term Eliquis use will likely worsen bleeding so educated patient on possible treatments for menorrhagia. Patient preferred oral progestin pills. Start NETA 5 mg daily. Evaluate symptoms outpatient and adjust dose as needed.  #Alcohol Use Patient has consumed alcohol daily for many years. Counseled about the risks of alcohol while on Eliquis. She feels confident that she can abstain. Offered naloxone to aid with alcohol cessation and she is agreeable.   Discharge Subjective:  Pt slept well, feeling better. Headache yesterday, better this morning. Agreeable for discharge. Spoke about switching from heparin to eliquis. Counseled about avoiding risk factors including alcohol, fall hazard. Still some numbness on left temple area, improved from yesterday. Up and walking around. No weakness. Pt would prefer pill progesterone over IUD for menorrhagia.   Discharge Exam:   BP 107/65 (BP Location: Left Arm)   Pulse 67   Temp 97.9 F (36.6 C) (Oral)   Resp 18   Ht 5\' 3"  (1.6 m)   Wt 58 kg   LMP 12/28/2022 (Approximate)   SpO2 96%   BMI 22.65 kg/m  Constitutional: well-appearing, laying in bed, in no acute distress HEENT: normocephalic atraumatic Pulmonary/Chest: normal work of breathing on room air Psych: normal mood and  affect Neuro: Cranial Nerves: II: PERRL.  III,IV, VI: EOMI without ptosis or diplopia.  V: Facial sensation is slightly diminished to touch on L but improved. VII: Facial movement is symmetric.  VIII: hearing is intact to voice X: not evaluated XI: not evaluated XII: tongue is midline without atrophy or fasciculations.  Motor: 5/5 strength was present in all four extremities.  Sensory: Sensation is symmetric to light touch in the arms and legs. Cerebellar: FNF and HKS are intact bilaterally   Pertinent Labs, Studies, and Procedures:     Latest Ref Rng & Units 01/08/2023    2:45 AM 01/07/2023    6:19 AM 01/06/2023    4:45 AM  CBC  WBC 4.0 - 10.5 K/uL 9.7  14.5  9.9   Hemoglobin 12.0 - 15.0 g/dL 13.2  44.0  10.2   Hematocrit 36.0 - 46.0 % 35.4  36.0  40.1   Platelets 150 - 400 K/uL 264  267  280        Latest Ref Rng &  Units 01/06/2023    4:45 AM 07/26/2022   12:18 AM 01/27/2020    6:34 PM  CMP  Glucose 70 - 99 mg/dL 90  086  578   BUN 6 - 20 mg/dL 8  7  9    Creatinine 0.44 - 1.00 mg/dL 4.69  6.29  5.28   Sodium 135 - 145 mmol/L 138  139  135   Potassium 3.5 - 5.1 mmol/L 3.3  3.6  3.3   Chloride 98 - 111 mmol/L 104  104  103   CO2 22 - 32 mmol/L 27  26  24    Calcium 8.9 - 10.3 mg/dL 8.6  8.9  8.9     ECHOCARDIOGRAM COMPLETE  Result Date: 01/07/2023    ECHOCARDIOGRAM REPORT   Patient Name:   GLORENE LEITZKE Date of Exam: 01/07/2023 Medical Rec #:  [id number]          Height:       63.0 in Accession #:    [id number]         Weight:       127.9 lb Date of Birth:  [birth year]          BSA:          1.599 m Patient Age:    32 years           BP:           101/74 mmHg Patient Gender: F                  HR:           86 bpm. Exam Location:  Inpatient Procedure: 2D Echo, 3D Echo, Cardiac Doppler, Color Doppler and Strain Analysis Indications:    Stroke I63.9  History:        Patient has no prior history of Echocardiogram examinations.                 Stroke and Substance abuse; Risk  Factors:Current Smoker.  Sonographer:    Jake Seats RDMS, RVT, RDCS Referring Phys: [phone] DENISE A WOLFE IMPRESSIONS  1. Left ventricular ejection fraction, by estimation, is 60 to 65%. Left ventricular ejection fraction by 3D volume is 60 %. The left ventricle has normal function. The left ventricle has no regional wall motion abnormalities. Left ventricular diastolic  parameters were normal. The average left ventricular global longitudinal strain is -23.3 %. The global longitudinal strain is normal.  2. Right ventricular systolic function is normal. The right ventricular size is normal. There is normal pulmonary artery systolic pressure.  3. The mitral valve is normal in structure. No evidence of mitral valve regurgitation. No evidence of mitral stenosis.  4. The aortic valve is normal in structure. Aortic valve regurgitation is not visualized. No aortic stenosis is present.  5. The inferior vena cava is normal in size with greater than 50% respiratory variability, suggesting right atrial pressure of 3 mmHg. Conclusion(s)/Recommendation(s): No intracardiac source of embolism detected on this transthoracic study. Consider a transesophageal echocardiogram to exclude cardiac source of embolism if clinically indicated. FINDINGS  Left Ventricle: Left ventricular ejection fraction, by estimation, is 60 to 65%. Left ventricular ejection fraction by 3D volume is 60 %. The left ventricle has normal function. The left ventricle has no regional wall motion abnormalities. The average left ventricular global longitudinal strain is -23.3 %. The global longitudinal strain is normal. The left ventricular internal cavity size was normal in size. There is no left ventricular hypertrophy. Left ventricular diastolic  parameters were normal. Right Ventricle: The right ventricular size is normal. No increase in right ventricular wall thickness. Right ventricular systolic function is normal. There is normal pulmonary artery systolic  pressure. The tricuspid regurgitant velocity is 1.59 m/s, and  with an assumed right atrial pressure of 3 mmHg, the estimated right ventricular systolic pressure is 13.1 mmHg. Left Atrium: Left atrial size was normal in size. Right Atrium: Right atrial size was normal in size. Pericardium: There is no evidence of pericardial effusion. Mitral Valve: The mitral valve is normal in structure. No evidence of mitral valve regurgitation. No evidence of mitral valve stenosis. Tricuspid Valve: The tricuspid valve is normal in structure. Tricuspid valve regurgitation is not demonstrated. No evidence of tricuspid stenosis. Aortic Valve: The aortic valve is normal in structure. Aortic valve regurgitation is not visualized. No aortic stenosis is present. Aortic valve mean gradient measures 5.0 mmHg. Aortic valve peak gradient measures 7.2 mmHg. Aortic valve area, by VTI measures 2.37 cm. Pulmonic Valve: The pulmonic valve was normal in structure. Pulmonic valve regurgitation is not visualized. No evidence of pulmonic stenosis. Aorta: The aortic root is normal in size and structure. Venous: The inferior vena cava is normal in size with greater than 50% respiratory variability, suggesting right atrial pressure of 3 mmHg. IAS/Shunts: No atrial level shunt detected by color flow Doppler.  LEFT VENTRICLE PLAX 2D LVIDd:         4.60 cm         Diastology LVIDs:         2.50 cm         LV e' medial:    9.46 cm/s LV PW:         0.90 cm         LV E/e' medial:  11.7 LV IVS:        0.50 cm         LV e' lateral:   11.00 cm/s LVOT diam:     1.80 cm         LV E/e' lateral: 10.1 LV SV:         64 LV SV Index:   40              2D LVOT Area:     2.54 cm        Longitudinal                                Strain                                2D Strain GLS  -24.9 % LV Volumes (MOD)               (A2C): LV vol d, MOD    87.4 ml       2D Strain GLS  -22.7 % A2C:                           (A3C): LV vol d, MOD    83.3 ml       2D Strain GLS   -22.4 % A4C:                           (A4C): LV vol s, MOD    28.1 ml  2D Strain GLS  -23.3 % A2C:                           Avg: LV vol s, MOD    32.5 ml A4C:                           3D Volume EF LV SV MOD A2C:   59.3 ml       LV 3D EF:    Left LV SV MOD A4C:   83.3 ml                    ventricul LV SV MOD BP:    55.7 ml                    ar                                             ejection                                             fraction                                             by 3D                                             volume is                                             60 %.                                 3D Volume EF:                                3D EF:        60 %                                LV EDV:       111 ml                                LV ESV:       44 ml                                LV SV:        67 ml RIGHT VENTRICLE RV S prime:     14.90 cm/s TAPSE (M-mode): 2.5 cm LEFT ATRIUM  Index        RIGHT ATRIUM          Index LA diam:        3.00 cm 1.88 cm/m   RA Area:     8.94 cm LA Vol (A2C):   34.1 ml 21.33 ml/m  RA Volume:   16.10 ml 10.07 ml/m LA Vol (A4C):   35.2 ml 22.02 ml/m LA Biplane Vol: 34.4 ml 21.52 ml/m  AORTIC VALVE AV Area (Vmax):    2.30 cm AV Area (Vmean):   2.32 cm AV Area (VTI):     2.37 cm AV Vmax:           134.00 cm/s AV Vmean:          103.000 cm/s AV VTI:            0.272 m AV Peak Grad:      7.2 mmHg AV Mean Grad:      5.0 mmHg LVOT Vmax:         121.00 cm/s LVOT Vmean:        93.800 cm/s LVOT VTI:          0.253 m LVOT/AV VTI ratio: 0.93  AORTA Ao Root diam: 3.30 cm Ao Asc diam:  2.70 cm MITRAL VALVE                TRICUSPID VALVE MV Area (PHT): 3.30 cm     TR Peak grad:   10.1 mmHg MV Decel Time: 230 msec     TR Vmax:        159.00 cm/s MV E velocity: 111.00 cm/s MV A velocity: 84.50 cm/s   SHUNTS MV E/A ratio:  1.31         Systemic VTI:  0.25 m                             Systemic Diam: 1.80 cm Rachelle Hora Croitoru MD  Electronically signed by Thurmon Fair MD Signature Date/Time: 01/07/2023/2:14:29 PM    Final    MR BRAIN WO CONTRAST  Result Date: 01/07/2023 CLINICAL DATA:  Provided history: Neuro deficit, acute, stroke suspected. EXAM: MRI HEAD WITHOUT CONTRAST TECHNIQUE: Multiplanar, multiecho pulse sequences of the brain and surrounding structures were obtained without intravenous contrast. COMPARISON:  CT of the head, maxillofacial structures and cervical spine 01/06/2023. CT venogram head 01/06/2023. FINDINGS: Brain: Cerebral volume is normal. No cortical encephalomalacia is identified. No significant cerebral white matter disease. There is no acute infarct. No evidence of an intracranial mass. No chronic intracranial blood products. No extra-axial fluid collection. No midline shift. Vascular: Maintained flow voids within the proximal large arterial vessels. Known dural venous sinus thrombosis extending from the torcula to the left internal jugular vein, better delineated on the CT venogram head 01/06/2023. Skull and upper cervical spine: No focal suspicious marrow lesion. Sinuses/Orbits: No mass or acute finding within the imaged orbits. No significant paranasal sinus disease. IMPRESSION: 1. Unremarkable non-contrast MRI appearance of the brain. No evidence of an acute infarct. 2. Known dural venous sinus thrombosis extending from the torcula to the left internal jugular vein, better delineated on the CT venogram head of 01/06/2023. Electronically Signed   By: Jackey Loge D.O.   On: 01/07/2023 09:44   VAS [place] LOWER EXTREMITY VENOUS (DVT)  Result Date: 01/07/2023  Lower Venous DVT Study Patient Name:  DEZARAI PREW  Date of Exam:   01/06/2023 Medical Rec #: [id number]  Accession #:    [id number] Date of Birth: 1990/08/21           Patient Gender: F Patient Age:   34 years Exam Location:  [facility] Procedure:      VAS [place] LOWER EXTREMITY VENOUS (DVT) Referring Phys: Angelique Blonder WOLFE  --------------------------------------------------------------------------------  Other Indications: Dural venous sinus thrombosis. Comparison Study: No prior studies. Performing Technologist: Jean Rosenthal RDMS, RVT  Examination Guidelines: A complete evaluation includes B-mode imaging, spectral Doppler, color Doppler, and power Doppler as needed of all accessible portions of each vessel. Bilateral testing is considered an integral part of a complete examination. Limited examinations for reoccurring indications may be performed as noted. The reflux portion of the exam is performed with the patient in reverse Trendelenburg.  +---------+---------------+---------+-----------+----------+--------------+ RIGHT    CompressibilityPhasicitySpontaneityPropertiesThrombus Aging +---------+---------------+---------+-----------+----------+--------------+ CFV      Full           Yes      Yes                                 +---------+---------------+---------+-----------+----------+--------------+ SFJ      Full                                                        +---------+---------------+---------+-----------+----------+--------------+ FV Prox  Full                                                        +---------+---------------+---------+-----------+----------+--------------+ FV Mid   Full           Yes      Yes                                 +---------+---------------+---------+-----------+----------+--------------+ FV DistalFull                                                        +---------+---------------+---------+-----------+----------+--------------+ PFV      Full                                                        +---------+---------------+---------+-----------+----------+--------------+ POP      Full           Yes      Yes                                 +---------+---------------+---------+-----------+----------+--------------+ PTV      Full                                                         +---------+---------------+---------+-----------+----------+--------------+  PERO     Full                                                        +---------+---------------+---------+-----------+----------+--------------+   +---------+---------------+---------+-----------+----------+--------------+ LEFT     CompressibilityPhasicitySpontaneityPropertiesThrombus Aging +---------+---------------+---------+-----------+----------+--------------+ CFV      Full           Yes      Yes                                 +---------+---------------+---------+-----------+----------+--------------+ SFJ      Full                                                        +---------+---------------+---------+-----------+----------+--------------+ FV Prox  Full                                                        +---------+---------------+---------+-----------+----------+--------------+ FV Mid   Full           Yes      Yes                                 +---------+---------------+---------+-----------+----------+--------------+ FV DistalFull                                                        +---------+---------------+---------+-----------+----------+--------------+ PFV      Full                                                        +---------+---------------+---------+-----------+----------+--------------+ POP      Full           Yes      Yes                                 +---------+---------------+---------+-----------+----------+--------------+ PTV      Full                                                        +---------+---------------+---------+-----------+----------+--------------+ PERO     Full                                                        +---------+---------------+---------+-----------+----------+--------------+  Summary: RIGHT: - There is no evidence of deep vein thrombosis in the  lower extremity.  - No cystic structure found in the popliteal fossa.  LEFT: - There is no evidence of deep vein thrombosis in the lower extremity.  - No cystic structure found in the popliteal fossa.  *See table(s) above for measurements and observations. Electronically signed by Gerarda Fraction on 01/07/2023 at 7:34:43 AM.    Final    VAS US CAROTID  Result Date: 01/06/2023 Carotid Arterial Duplex Study Patient Name:  DLYNN RANES  Date of Exam:   01/06/2023 Medical Rec #: [id number]           Accession #:    [id number] Date of Birth: [birth year]           Patient Gender: F Patient Age:   71 years Exam Location:  [facility] Procedure:      VAS US CAROTID Referring Phys: Erlinda Hong --------------------------------------------------------------------------------  Indications:       CVA and Headaches for months and left facial tingling for the                    last few days. Risk Factors:      Current smoker. Comparison Study:  No priors. Performing Technologist: Marilynne Halsted RDMS, RVT  Examination Guidelines: A complete evaluation includes B-mode imaging, spectral Doppler, color Doppler, and power Doppler as needed of all accessible portions of each vessel. Bilateral testing is considered an integral part of a complete examination. Limited examinations for reoccurring indications may be performed as noted.  Right Carotid Findings: +----------+--------+--------+--------+------------------+------------------+           PSV cm/sEDV cm/sStenosisPlaque DescriptionComments           +----------+--------+--------+--------+------------------+------------------+ CCA Prox  114     22                                                   +----------+--------+--------+--------+------------------+------------------+ CCA Distal89      22                                intimal thickening +----------+--------+--------+--------+------------------+------------------+ ICA Prox  79      27                                 intimal thickening +----------+--------+--------+--------+------------------+------------------+ ICA Distal75      31                                                   +----------+--------+--------+--------+------------------+------------------+ ECA       78      19                                                   +----------+--------+--------+--------+------------------+------------------+ +----------+--------+-------+----------------+-------------------+           PSV cm/sEDV cmsDescribe        Arm Pressure (mmHG) +----------+--------+-------+----------------+-------------------+ [id number]  Multiphasic, WNL                    +----------+--------+-------+----------------+-------------------+ +---------+--------+--+--------+--+---------+ VertebralPSV cm/s58EDV cm/s21Antegrade +---------+--------+--+--------+--+---------+  Left Carotid Findings: +----------+--------+--------+--------+------------------+------------------+           PSV cm/sEDV cm/sStenosisPlaque DescriptionComments           +----------+--------+--------+--------+------------------+------------------+ CCA Prox  91      16                                                   +----------+--------+--------+--------+------------------+------------------+ CCA Distal79      23                                intimal thickening +----------+--------+--------+--------+------------------+------------------+ ICA Prox  82      32                                intimal thickening +----------+--------+--------+--------+------------------+------------------+ ICA Distal105     42                                                   +----------+--------+--------+--------+------------------+------------------+ ECA       70      16                                                   +----------+--------+--------+--------+------------------+------------------+  +----------+--------+--------+----------------+-------------------+           PSV cm/sEDV cm/sDescribe        Arm Pressure (mmHG) +----------+--------+--------+----------------+-------------------+ [id number]             Multiphasic, WNL                    +----------+--------+--------+----------------+-------------------+ +---------+--------+--+--------+--+---------+ VertebralPSV cm/s51EDV cm/s16Antegrade +---------+--------+--+--------+--+---------+   Summary: Right Carotid: The extracranial vessels were near-normal with only minimal wall                thickening or plaque. Left Carotid: The extracranial vessels were near-normal with only minimal wall               thickening or plaque. Vertebrals:  Bilateral vertebral arteries demonstrate antegrade flow. Subclavians: Normal flow hemodynamics were seen in bilateral subclavian              arteries. *See table(s) above for measurements and observations.  Electronically signed by Delia Heady MD on 01/06/2023 at 12:40:06 PM.    Final    CT VENOGRAM HEAD  Result Date: 01/06/2023 CLINICAL DATA:  Abnormal head CT. EXAM: CT VENOGRAM HEAD TECHNIQUE: Venographic phase images of the brain were obtained following the administration of intravenous contrast. Multiplanar reformats and maximum intensity projections were generated. RADIATION DOSE REDUCTION: This exam was performed according to the departmental dose-optimization program which includes automated exposure control, adjustment of the mA and/or kV according to patient size and/or use of iterative reconstruction technique. CONTRAST:  75mL OMNIPAQUE IOHEXOL 350 MG/ML SOLN COMPARISON:  Head CT from earlier today FINDINGS:  Discrete occlusive clot with non filling extending from the torcula throughout the left transverse and sigmoid sinuses into the upper left IJ. The central and cortical veins are symmetrically enhancing. No evidence of brain edema or intracranial hemorrhage. Major arterial vessels  are enhancing. 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 No brain edema or hemorrhage. Electronically Signed   By: Tiburcio Pea M.D.   On: 01/06/2023 06:03   CT Head Wo Contrast  Result Date: 01/06/2023 CLINICAL DATA:  Neck trauma with dangerous injury mechanism. EXAM: CT HEAD WITHOUT CONTRAST CT MAXILLOFACIAL WITHOUT CONTRAST CT CERVICAL SPINE WITHOUT CONTRAST TECHNIQUE: Multidetector CT imaging of the head, cervical spine, and maxillofacial structures were performed using the standard protocol without intravenous contrast. Multiplanar CT image reconstructions of the cervical spine and maxillofacial structures were also generated. RADIATION DOSE REDUCTION: This exam was performed according to the departmental dose-optimization program which includes automated exposure control, adjustment of the mA and/or kV according to patient size and/or use of iterative reconstruction technique. COMPARISON:  Head CT 07/26/2022 FINDINGS: CT HEAD FINDINGS Brain: No evidence of acute infarction, hemorrhage, hydrocephalus, extra-axial collection or mass lesion/mass effect. Vascular: High-density appearance of the left transverse and sigmoid sinus also involving a probable cortical vein at the tentorium. Skull: Negative for fracture. CT MAXILLOFACIAL FINDINGS Osseous: Negative for mandibular fracture or dislocation. Advanced dental caries with multiple periapical erosions. Orbits: No evidence of injury Sinuses: No evidence of injury.  Anterior nasal septal perforation. Soft tissues: No evidence of injury CT CERVICAL SPINE FINDINGS Alignment: No traumatic malalignment Skull base and vertebrae: No acute fracture Soft tissues and spinal canal: No prevertebral fluid or swelling. No visible canal hematoma. Disc levels:  No evidence of cervical spine degeneration Upper chest: No evidence of injury IMPRESSION: 1. No  evidence of intracranial or cervical spine injury. Negative for facial fracture. 2. High-density appearance at the left transverse and sigmoid dural sinuses, recommend CT venogram to evaluate for dural venous sinus thrombosis. Electronically Signed   By: Tiburcio Pea M.D.   On: 01/06/2023 04:27   CT Maxillofacial Wo Contrast  Result Date: 01/06/2023 CLINICAL DATA:  Neck trauma with dangerous injury mechanism. EXAM: CT HEAD WITHOUT CONTRAST CT MAXILLOFACIAL WITHOUT CONTRAST CT CERVICAL SPINE WITHOUT CONTRAST TECHNIQUE: Multidetector CT imaging of the head, cervical spine, and maxillofacial structures were performed using the standard protocol without intravenous contrast. Multiplanar CT image reconstructions of the cervical spine and maxillofacial structures were also generated. RADIATION DOSE REDUCTION: This exam was performed according to the departmental dose-optimization program which includes automated exposure control, adjustment of the mA and/or kV according to patient size and/or use of iterative reconstruction technique. COMPARISON:  Head CT 07/26/2022 FINDINGS: CT HEAD FINDINGS Brain: No evidence of acute infarction, hemorrhage, hydrocephalus, extra-axial collection or mass lesion/mass effect. Vascular: High-density appearance of the left transverse and sigmoid sinus also involving a probable cortical vein at the tentorium. Skull: Negative for fracture. CT MAXILLOFACIAL FINDINGS Osseous: Negative for mandibular fracture or dislocation. Advanced dental caries with multiple periapical erosions. Orbits: No evidence of injury Sinuses: No evidence of injury.  Anterior nasal septal perforation. Soft tissues: No evidence of injury CT CERVICAL SPINE FINDINGS Alignment: No traumatic malalignment Skull base and vertebrae: No acute fracture Soft tissues and spinal canal: No prevertebral fluid or swelling. No visible canal hematoma. Disc levels:  No evidence of cervical spine degeneration Upper chest: No evidence  of injury IMPRESSION: 1. No  evidence of intracranial or cervical spine injury. Negative for facial fracture. 2. High-density appearance at the left transverse and sigmoid dural sinuses, recommend CT venogram to evaluate for dural venous sinus thrombosis. Electronically Signed   By: Tiburcio Pea M.D.   On: 01/06/2023 04:27   CT Cervical Spine Wo Contrast  Result Date: 01/06/2023 CLINICAL DATA:  Neck trauma with dangerous injury mechanism. EXAM: CT HEAD WITHOUT CONTRAST CT MAXILLOFACIAL WITHOUT CONTRAST CT CERVICAL SPINE WITHOUT CONTRAST TECHNIQUE: Multidetector CT imaging of the head, cervical spine, and maxillofacial structures were performed using the standard protocol without intravenous contrast. Multiplanar CT image reconstructions of the cervical spine and maxillofacial structures were also generated. RADIATION DOSE REDUCTION: This exam was performed according to the departmental dose-optimization program which includes automated exposure control, adjustment of the mA and/or kV according to patient size and/or use of iterative reconstruction technique. COMPARISON:  Head CT 07/26/2022 FINDINGS: CT HEAD FINDINGS Brain: No evidence of acute infarction, hemorrhage, hydrocephalus, extra-axial collection or mass lesion/mass effect. Vascular: High-density appearance of the left transverse and sigmoid sinus also involving a probable cortical vein at the tentorium. Skull: Negative for fracture. CT MAXILLOFACIAL FINDINGS Osseous: Negative for mandibular fracture or dislocation. Advanced dental caries with multiple periapical erosions. Orbits: No evidence of injury Sinuses: No evidence of injury.  Anterior nasal septal perforation. Soft tissues: No evidence of injury CT CERVICAL SPINE FINDINGS Alignment: No traumatic malalignment Skull base and vertebrae: No acute fracture Soft tissues and spinal canal: No prevertebral fluid or swelling. No visible canal hematoma. Disc levels:  No evidence of cervical spine  degeneration Upper chest: No evidence of injury IMPRESSION: 1. No evidence of intracranial or cervical spine injury. Negative for facial fracture. 2. High-density appearance at the left transverse and sigmoid dural sinuses, recommend CT venogram to evaluate for dural venous sinus thrombosis. Electronically Signed   By: Tiburcio Pea M.D.   On: 01/06/2023 04:27     Discharge Instructions: Discharge Instructions     Ambulatory referral to Neurology   Complete by: As directed    Follow up with stroke clinic NP (Jessica Vanschaick or Darrol Angel, if both not available, consider Manson Allan, or Ahern) at [facility] in about 4 weeks. Thanks.   Call MD for:  difficulty breathing, headache or visual disturbances   Complete by: As directed    Call MD for:  persistant dizziness or light-headedness   Complete by: As directed    Call MD for:  persistant nausea and vomiting   Complete by: As directed    Call MD for:  severe uncontrolled pain   Complete by: As directed    Diet general   Complete by: As directed    Discharge instructions   Complete by: As directed    Is a pleasure take care of you [facility].  You were admitted and treated for a dural venous sinus thrombosis.   Please note the following medication changes:  Please take two 5 mg tablets of Eliquis twice daily for 6 days (10 mg twice a day).  Then switch to taking one 5 mg tablet of Eliquis twice per day.  The pharmacist has included more instructions about this in your discharge.  It is very important that you stop drinking alcohol to reduce your risk of falling while taking these blood thinners.  paperwork.  Please take 1 tablet of vitamin B12, vitamin B6, and folic acid every day.  Take one 50 mg tablet of naltrexone daily to help reduce your cravings for  alcohol.  It will be important for you to abstain from alcohol and smoking, as these are significant risk factors for venous thromboembolism.  We have started an oral  iron supplement, which you should take once daily with breakfast.  We have started a progestin-only birth control, Aygestin. Please take one 5mg  tablet daily. This should help reduce your menorrhagia.  Please stop taking your Meloxicam.   You can continue taking your Zofran as needed.  Please schedule follow-up appointment with your primary care provider, Dr. Chrys Racer, in the next few weeks.   Increase activity slowly   Complete by: As directed        Signed: Annett Fabian, MD 01/13/2023, 3:04 PM

## 2023-01-08 NOTE — TOC Transition Note (Signed)
Transition of Care [facility]) - CM/SW Discharge Note   Patient Details  Name: [name] MRN: [id number] Date of Birth: 04/14/1991  [facility]) CM/SW Contact:  Kermit Balo, RN Phone Number: 01/08/2023, 2:51 PM   Clinical Narrative:    Pt is discharging home with self care. No f/u per therapies.  Pt has transportation home.   Final next level of care: Home/Self Care Barriers to Discharge: No Barriers Identified   Patient Goals and CMS Choice      Discharge Placement                         Discharge Plan and Services Additional resources added to the After Visit Summary for                                       Social Determinants of Health (SDOH) Interventions SDOH Screenings   Food Insecurity: No Food Insecurity (01/06/2023)  Housing: Low Risk  (01/06/2023)  Transportation Needs: No Transportation Needs (01/06/2023)  Utilities: Not At Risk (01/06/2023)  Tobacco Use: High Risk (01/07/2023)     Readmission Risk Interventions     No data to display

## 2023-01-08 NOTE — Progress Notes (Addendum)
STROKE TEAM PROGRESS NOTE   INTERVAL HISTORY Her family is at the bedside.  She is sitting in bed, neuro intact.  Left head heaviness feeling much improved.  Transition from heparin IV to DOAC.  Discussed about hyperhomocysteinemia and quit smoking, cocaine, THC and limit alcohol use.  Vitals:   01/08/23 0441 01/08/23 0633 01/08/23 0736 01/08/23 1141  BP: (!) 81/59 98/66 107/67 103/79  Pulse: 62 (!) 58 61 71  Resp:   18 18  Temp: 97.7 F (36.5 C)  97.7 F (36.5 C) 97.9 F (36.6 C)  TempSrc: Oral  Oral Oral  SpO2: 98%  99% 100%  Weight:      Height:       CBC:  Recent Labs  Lab 01/06/23 0445 01/07/23 0619 01/08/23 0245  WBC 9.9 14.5* 9.7  NEUTROABS 6.2  --   --   HGB 13.5 12.1 11.7*  HCT 40.1 36.0 35.4*  MCV 101.0* 102.0* 100.0  PLT 280 267 264   Basic Metabolic Panel:  Recent Labs  Lab 01/06/23 0445 01/06/23 0542  NA 138  --   K 3.3*  --   CL 104  --   CO2 27  --   GLUCOSE 90  --   BUN 8  --   CREATININE 0.53  --   CALCIUM 8.6*  --   MG  --  1.9   Lipid Panel:  Recent Labs  Lab 01/07/23 0622  CHOL 117  TRIG 96  HDL 40*  CHOLHDL 2.9  VLDL 19  LDLCALC 58   HgbA1c:  Recent Labs  Lab 01/06/23 1016  HGBA1C 5.2   Urine Drug Screen:  Recent Labs  Lab 01/06/23 0855  LABOPIA POSITIVE*  COCAINSCRNUR POSITIVE*  LABBENZ POSITIVE*  AMPHETMU NONE DETECTED  THCU POSITIVE*  LABBARB NONE DETECTED    Alcohol Level No results for input(s): "ETH" in the last 168 hours.  IMAGING past 24 hours No results found.  PHYSICAL EXAM  Temp:  [97.7 F (36.5 C)-99.1 F (37.3 C)] 97.9 F (36.6 C) (07/10 1141) Pulse Rate:  [58-78] 71 (07/10 1141) Resp:  [18-20] 18 (07/10 1141) BP: (81-148)/(59-79) 103/79 (07/10 1141) SpO2:  [98 %-100 %] 100 % (07/10 1141)  General - Well nourished, well developed, in no apparent distress. Cardiovascular - Regular rhythm and rate.  Mental Status -  Level of arousal and orientation to time, place, and person were  intact. Language including expression, naming, repetition, comprehension was assessed and found intact. Attention span and concentration were normal. Recent and remote memory were intact. Fund of Knowledge was assessed and was intact.  Cranial Nerves II - XII - II - Visual field intact OU. III, IV, VI - Extraocular movements intact. V -crease sensation on left face VII - Facial movement intact bilaterally. VIII - Hearing & vestibular intact bilaterally. X - Palate elevates symmetrically. XI - Chin turning & shoulder shrug intact bilaterally. XII - Tongue protrusion intact.  Motor Strength - The patient's strength was normal in all extremities and pronator drift was absent.  Bulk was normal and fasciculations were absent.   Motor Tone - Muscle tone was assessed at the neck and appendages and was normal. Sensory - Light touch, temperature/pinprick were assessed and were symmetrical.    Coordination - The patient had normal movements in the hands and feet with no ataxia or dysmetria.  Tremor was absent.  Gait and Station - deferred.  ASSESSMENT/PLAN [name] is a 32 y.o. female with history of recent  concussion, polysubstance abuse, alcohol abuse, tobacco abuse whopresents with persistent headache and had CT Head and CTV which demonstrated occlusive dural venous sinus thrombosis extending from the torcula to the left IJ and including the entire left transverse and sigmoid sinuses. 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 No brain edema or hemorrhage. MRI No acute process Carotid Doppler negative 2D Echo EF 60 to 65% [place] LE negative for  DVT UDS positive for opiates, cocaine, benzos, THC Hypercoagulable panel largely normal except homocystine 54.8 ANA negative LDL 58 HgbA1c 5.2 VTE prophylaxis -heparin IV No antithrombotics prior to admission, now transition from heparin IV to Eliquis. May consider hematology consult for [facility] duration.  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 Please call with questions. Pt will follow up with stroke clinic NP at [facility] in about 4 weeks. Thanks for the consult.   Marvel Plan, MD PhD Stroke Neurology 01/08/2023 3:33 PM   To contact Stroke Continuity provider, please refer to [email or web address]. After hours, contact General Neurology

## 2023-01-08 NOTE — Discharge Instructions (Addendum)
Dear [name],  We are glad to see that you are feeling better. You were admitted to the hospital for a clot in vein in your brain. You were treated with medications to help break up the clot.  Please start taking these medications: -Eliquis (apixaban) 5 mg twice per day for your clot for at least 3 months. -Norethindrone (Aygestin) 5 mg daily to decrease period bleeding. -Iron supplement 325 mg every other day. -Naltrexone 50 mg daily to reduce alcohol cravings.  It is very important that you do not drink alcohol or use any substances that could cause you to fall.  Please follow up with your primary doctor.  It was a pleasure taking care of you in the hospital!  Information on my medicine - ELIQUIS (apixaban)   Why was Eliquis prescribed for you? Eliquis was prescribed to treat blood clots that may have been found in the veins of your legs (deep vein thrombosis) or in your lungs (pulmonary embolism) and to reduce the risk of them occurring again.  What do You need to know about Eliquis ? The starting dose is 10 mg (two 5 mg tablets) taken TWICE daily for the FIRST SEVEN (7) DAYS, then on (enter date)  01/15/23  the dose is reduced to ONE 5 mg tablet taken TWICE daily.  Eliquis may be taken with or without food.   Try to take the dose about the same time in the morning and in the evening. If you have difficulty swallowing the tablet whole please discuss with your pharmacist how to take the medication safely.  Take Eliquis exactly as prescribed and DO NOT stop taking Eliquis without talking to the doctor who prescribed the medication.  Stopping may increase your risk of developing a new blood clot.  Refill your prescription before you run out.  After discharge, you should have regular check-up appointments with your healthcare provider that is prescribing your Eliquis.    What do you do if you miss a dose? If a dose of ELIQUIS is not taken at the scheduled time, take it as  soon as possible on the same day and twice-daily administration should be resumed. The dose should not be doubled to make up for a missed dose.  Important Safety Information A possible side effect of Eliquis is bleeding. You should call your healthcare provider right away if you experience any of the following: Bleeding from an injury or your nose that does not stop. Unusual colored urine (red or dark brown) or unusual colored stools (red or black). Unusual bruising for unknown reasons. A serious fall or if you hit your head (even if there is no bleeding).  Some medicines may interact with Eliquis and might increase your risk of bleeding or clotting while on Eliquis. To help avoid this, consult your healthcare provider or pharmacist prior to using any new prescription or non-prescription medications, including herbals, vitamins, non-steroidal anti-inflammatory drugs (NSAIDs) and supplements.  This website has more information on Eliquis (apixaban): http://www.eliquis.com/eliquis/home

## 2023-01-13 LAB — PROTHROMBIN GENE MUTATION

## 2023-01-17 ENCOUNTER — Telehealth: Payer: Medicaid Other | Admitting: Nurse Practitioner

## 2023-01-17 DIAGNOSIS — J069 Acute upper respiratory infection, unspecified: Secondary | ICD-10-CM

## 2023-01-17 MED ORDER — BENZONATATE 100 MG PO CAPS
100.0000 mg | ORAL_CAPSULE | Freq: Three times a day (TID) | ORAL | 0 refills | Status: DC | PRN
Start: 2023-01-17 — End: 2023-09-08

## 2023-01-17 NOTE — Progress Notes (Signed)
E-Visit for Cough  If you take a COVID test and are positive please schedule a follow up They should be available for purchase at your pharmacy  Below are some recommendations and a prescription for your cough   We are sorry that you are not feeling well.  Here is how we plan to help!  Based on your presentation I believe you most likely have A cough due to a virus.  This is called viral bronchitis and is best treated by rest, plenty of fluids and control of the cough.  You may use Ibuprofen or Tylenol as directed to help your symptoms.     In addition you may use A prescription cough medication called Tessalon Perles 100mg . You may take 1-2 capsules every 8 hours as needed for your cough.   From your responses in the eVisit questionnaire you describe inflammation in the upper respiratory tract which is causing a significant cough.  This is commonly called Bronchitis and has four common causes:   Allergies Viral Infections Acid Reflux Bacterial Infection Allergies, viruses and acid reflux are treated by controlling symptoms or eliminating the cause. An example might be a cough caused by taking certain blood pressure medications. You stop the cough by changing the medication. Another example might be a cough caused by acid reflux. Controlling the reflux helps control the cough.  USE OF BRONCHODILATOR ("RESCUE") INHALERS: There is a risk from using your bronchodilator too frequently.  The risk is that over-reliance on a medication which only relaxes the muscles surrounding the breathing tubes can reduce the effectiveness of medications prescribed to reduce swelling and congestion of the tubes themselves.  Although you feel brief relief from the bronchodilator inhaler, your asthma may actually be worsening with the tubes becoming more swollen and filled with mucus.  This can delay other crucial treatments, such as oral steroid medications. If you need to use a bronchodilator inhaler daily, several  times per day, you should discuss this with your provider.  There are probably better treatments that could be used to keep your asthma under control.     HOME CARE Only take medications as instructed by your medical team. Complete the entire course of an antibiotic. Drink plenty of fluids and get plenty of rest. Avoid close contacts especially the very young and the elderly Cover your mouth if you cough or cough into your sleeve. Always remember to wash your hands A steam or ultrasonic humidifier can help congestion.   GET HELP RIGHT AWAY IF: You develop worsening fever. You become short of breath You cough up blood. Your symptoms persist after you have completed your treatment plan MAKE SURE YOU  Understand these instructions. Will watch your condition. Will get help right away if you are not doing well or get worse.    Thank you for choosing an e-visit.  Your e-visit answers were reviewed by a board certified advanced clinical practitioner to complete your personal care plan. Depending upon the condition, your plan could have included both over the counter or prescription medications.  Please review your pharmacy choice. Make sure the pharmacy is open so you can pick up prescription now. If there is a problem, you may contact your provider through [employer] and have the prescription routed to another pharmacy.  Your safety is important to [place]. If you have drug allergies check your prescription carefully.   For the next 24 hours you can use MyChart to ask questions about today's visit, request a non-urgent call back,  or ask for a work or school excuse. You will get an email in the next two days asking about your experience. I hope that your e-visit has been valuable and will speed your recovery.   Meds ordered this encounter  Medications   benzonatate (TESSALON) 100 MG capsule    Sig: Take 1 capsule (100 mg total) by mouth 3 (three) times daily as needed.    Dispense:  30  capsule    Refill:  0    I spent approximately 5 minutes reviewing the patient's history, current symptoms and coordinating their care today.

## 2023-01-20 LAB — FACTOR 5 LEIDEN

## 2023-01-28 DIAGNOSIS — F32A Depression, unspecified: Secondary | ICD-10-CM | POA: Diagnosis not present

## 2023-01-28 DIAGNOSIS — G08 Intracranial and intraspinal phlebitis and thrombophlebitis: Secondary | ICD-10-CM | POA: Diagnosis not present

## 2023-01-28 DIAGNOSIS — Z09 Encounter for follow-up examination after completed treatment for conditions other than malignant neoplasm: Secondary | ICD-10-CM | POA: Diagnosis not present

## 2023-01-28 DIAGNOSIS — F419 Anxiety disorder, unspecified: Secondary | ICD-10-CM | POA: Diagnosis not present

## 2023-01-28 DIAGNOSIS — F17210 Nicotine dependence, cigarettes, uncomplicated: Secondary | ICD-10-CM | POA: Diagnosis not present

## 2023-01-28 DIAGNOSIS — E559 Vitamin D deficiency, unspecified: Secondary | ICD-10-CM | POA: Diagnosis not present

## 2023-01-28 DIAGNOSIS — Z6822 Body mass index (BMI) 22.0-22.9, adult: Secondary | ICD-10-CM | POA: Diagnosis not present

## 2023-01-30 DIAGNOSIS — Z419 Encounter for procedure for purposes other than remedying health state, unspecified: Secondary | ICD-10-CM | POA: Diagnosis not present

## 2023-01-31 DIAGNOSIS — Z79899 Other long term (current) drug therapy: Secondary | ICD-10-CM | POA: Diagnosis not present

## 2023-02-13 NOTE — Progress Notes (Deleted)
[facility] [address] [place].  [address] [phone]       HOSPITAL FOLLOW UP NOTE  [name] Date of Birth:  1990/10/08 Medical Record Number:  [id number]   Reason for Referral:  hospital stroke follow up    SUBJECTIVE:   CHIEF COMPLAINT:  No chief complaint on file.   HPI:   [name] is a 32 y.o. female with history of recent concussion, polysubstance abuse, alcohol abuse, tobacco abuse who presented to ED on 01/06/2023 with persistent headache. CTH and CTV showed occlusive dural venous sinus thrombosis extending from the torcula to the left IJ and including the entire left transverse and sigmoid sinuses.  MRI no acute process.  Placed on IV heparin and transition to Eliquis, recommended hematology consult for [facility] duration.  UDS positive for opioids, cocaine, benzos and THC.  Hypercoagulable panel largely normal except homocystine 54.8 likely related to smoking, started B12, folate and B6.  Polysubstance abuse cessation discussed.        PERTINENT IMAGING  CT head High-density appearance at the left transverse and sigmoid dural sinuses CTV-Occlusive dural venous sinus thrombosis extending from the torcula to the left IJ and including the entire left transverse and sigmoid sinuses. No brain edema or hemorrhage. 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   Chronic headache    Cocaine abuse (HCC)    Concussion    Marijuana abuse    Nicotine dependence    Opioid use disorder    Polysubstance abuse (HCC)    Smoker     PSH:  Past  Surgical History:  Procedure Laterality Date   APPENDECTOMY      Social History:  Social History   Socioeconomic History   Marital status: Single    Spouse name: Not on file   Number of children: Not on file   Years of education: Not on file   Highest education level: Not on file  Occupational History   Not on file  Tobacco Use   Smoking status: Every Day    Current packs/day: 0.50    Types: Cigarettes   Smokeless tobacco: Never  Vaping Use   Vaping status: Never Used  Substance and Sexual Activity   Alcohol use: Yes    Comment: occ   Drug use: Yes    Types: Cocaine, Benzodiazepines, Marijuana    Comment: last used 06/02/18   Sexual activity: Yes    Birth control/protection: None  Other Topics Concern   Not on file  Social History Narrative   Not on file   Social Determinants of Health   Financial Resource Strain: Not on file  Food Insecurity: No Food Insecurity (01/06/2023)   Hunger Vital Sign    Worried About Running Out of Food in the Last Year: Never true    Ran Out of Food in the Last Year: Never true  Transportation Needs: No Transportation Needs (01/06/2023)   PRAPARE - Administrator, Civil Service (Medical): No    Lack of Transportation (Non-Medical): No  Physical Activity: Not on file  Stress:  Not on file  Social Connections: Not on file  Intimate Partner Violence: Not At Risk (01/06/2023)   Humiliation, Afraid, Rape, and Kick questionnaire    Fear of Current or Ex-Partner: No    Emotionally Abused: No    Physically Abused: No    Sexually Abused: No    Family History:  Family History  Problem Relation Age of Onset   Diabetes Mother    Hypertension Mother     Medications:   Current Outpatient Medications on File Prior to Visit  Medication Sig Dispense Refill   apixaban (ELIQUIS) 5 MG TABS tablet Take 2 tablets (10 mg total) by mouth 2 (two) times daily for 6 days. Then take 1 tablet (5mg ) twice daily. 70 tablet 0   apixaban (ELIQUIS) 5  MG TABS tablet Take 1 tablet (5 mg total) by mouth 2 (two) times daily. 130 tablet 0   benzonatate (TESSALON) 100 MG capsule Take 1 capsule (100 mg total) by mouth 3 (three) times daily as needed. 30 capsule 0   ferrous sulfate 325 (65 FE) MG tablet Take 1 tablet (325 mg total) by mouth daily with breakfast. 100 tablet 0   norethindrone (AYGESTIN) 5 MG tablet Take 1 tablet (5 mg total) by mouth daily. 30 tablet 0   ondansetron (ZOFRAN-ODT) 4 MG disintegrating tablet Take 1 tablet (4 mg total) by mouth every 8 (eight) hours as needed for nausea. 10 tablet 0   pyridOXINE (B-6) 50 MG tablet Take 1 tablet (50 mg total) by mouth daily. 100 tablet 0   cyanocobalamin 1000 MCG tablet Take 1 tablet (1000 mcg total) by mouth daily. 130 tablet 0   No current facility-administered medications on file prior to visit.    Allergies:   Allergies  Allergen Reactions   Gentamicin Rash   Penicillins Rash      OBJECTIVE:  Physical Exam  There were no vitals filed for this visit. There is no height or weight on file to calculate BMI. No results found.     04/19/2016    2:13 PM  Depression screen PHQ 2/9  Decreased Interest 0  Down, Depressed, Hopeless 0  PHQ - 2 Score 0     General: well developed, well nourished, seated, in no evident distress Head: head normocephalic and atraumatic.   Neck: supple with no carotid or supraclavicular bruits Cardiovascular: regular rate and rhythm, no murmurs Musculoskeletal: no deformity Skin:  no rash/petichiae Vascular:  Normal pulses all extremities   Neurologic Exam Mental Status: Awake and fully alert. Oriented to place and time. Recent and remote memory intact. Attention span, concentration and fund of knowledge appropriate. Mood and affect appropriate.  Cranial Nerves: Fundoscopic exam reveals sharp disc margins. Pupils equal, briskly reactive to light. Extraocular movements full without nystagmus. Visual fields full to confrontation. Hearing intact.  Facial sensation intact. Face, tongue, palate moves normally and symmetrically.  Motor: Normal bulk and tone. Normal strength in all tested extremity muscles Sensory.: intact to touch , pinprick , position and vibratory sensation.  Coordination: Rapid alternating movements normal in all extremities. Finger-to-nose and heel-to-shin performed accurately bilaterally. Gait and Station: Arises from chair without difficulty. Stance is normal. Gait demonstrates normal stride length and balance with ***. Tandem walk and heel toe ***.  Reflexes: 1+ and symmetric. Toes downgoing.     NIHSS  *** Modified Rankin  ***      ASSESSMENT: [name] is a 32 y.o. year old female with dural venous sinus thrombosis of left transverse and  sigmoid sinuses on 01/06/2023 likely secondary to polysubstance abuse and hyperhomocysteinemia. Vascular risk factors include polysubstance abuse (cocaine, opiates, benzos, THC, EtOH, tobacco), and hyperhomocysteinemia.      PLAN:  CVST :  Residual deficit: ***.  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 Pearlean Brownie PCP: Chrys Racer, MD    I spent *** minutes of face-to-face and non-face-to-face time with patient.  This included previsit chart review including review of recent hospitalization, lab review, study review, order entry, electronic health record documentation, patient education regarding recent stroke including etiology, secondary stroke prevention measures and importance of managing stroke risk factors, residual deficits and  typical recovery time and answered all other questions to patient satisfaction   Ihor Austin, AGNP-BC  [facility] [address] [place], [place] [address]  Phone [phone] Fax [phone] Note: This document was prepared with digital dictation and possible smart phrase technology. Any transcriptional errors that result from this process are unintentional.

## 2023-02-17 ENCOUNTER — Inpatient Hospital Stay: Payer: Medicaid Other | Admitting: Adult Health

## 2023-02-17 ENCOUNTER — Encounter: Payer: Self-pay | Admitting: Adult Health

## 2023-03-02 DIAGNOSIS — Z419 Encounter for procedure for purposes other than remedying health state, unspecified: Secondary | ICD-10-CM | POA: Diagnosis not present

## 2023-03-06 NOTE — Progress Notes (Signed)
[facility] [address] [place]. [place] [address] [phone]       HOSPITAL FOLLOW UP NOTE  [name] Date of Birth:  July 26, 1990 Medical Record Number:  [id number]   Reason for Referral:  hospital stroke follow up    SUBJECTIVE:   CHIEF COMPLAINT:  Chief Complaint  Patient presents with   Follow-up    Rm 3, here alone  Pt is here for hospital follow up after cerebral venous sinus thrombosis. Pt states she is doing well aside from headaches, states she gets 2-3 headaches a week takes tylenol for them.     HPI:   [name] is a 32 y.o. female with history of recent concussion, polysubstance abuse, alcohol abuse, tobacco abuse who presented to ED on 01/06/2023 with persistent headache. CTH and CTV showed occlusive dural venous sinus thrombosis extending from the torcula to the left IJ and including the entire left transverse and sigmoid sinuses.  MRI no acute process.  Placed on IV heparin and transition to Eliquis, recommended hematology consult for [facility] duration.  UDS positive for opioids, cocaine, benzos and THC.  Hypercoagulable panel largely normal except homocystine 54.8 likely related to smoking, started B12, folate and B6.  Polysubstance abuse cessation discussed.   Patient being seen today, 03/10/2023, for hospital follow up unaccompanied. She has been doing well overall since discharge but continues to experience headaches but gradually improving since discharge, located top of head and occipital, about 2-3 per week, will use tylenol, occasionally beneficial and will resolve after 1 hr but when Tylenol not beneficial, will have to sit in dark room, will have associated photophobia, denies phonophobia or N/V. Was having frequent headaches that started back in 07/2021, no prior treatment.  Denies new stroke/TIA symptoms.  Continues to Eliquis, does have hx of menorrhagia, slightly worsened on Eliquis, does not follow with OBGYN. Has seen  PCP since discharge and f/u scheduled 9/19. Reports some labs completed but unsure which ones were completed.  She continues to take B12, folic acid and iron supplements.  Denies any additional drug use since discharge. Has had a couple beers since discharge but denies excessive use.  Continue tobacco use although gradually reducing amount, currently 0.5 PPD, prior over 1 PPD, goal is to eventually quit.      PERTINENT IMAGING  CT head High-density appearance at the left transverse and sigmoid dural sinuses CTV-Occlusive dural venous sinus thrombosis extending from the torcula to the left IJ and including the entire left transverse and sigmoid sinuses. No brain edema or hemorrhage. 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   Chronic headache    Cocaine abuse (HCC)    Concussion    Marijuana abuse    Nicotine dependence    Opioid use disorder    Polysubstance abuse (HCC)    Smoker     PSH:  Past Surgical History:  Procedure Laterality Date   APPENDECTOMY      Social History:  Social History   Socioeconomic History   Marital status: Single    Spouse name:  Not on file   Number of children: Not on file   Years of education: Not on file   Highest education level: Not on file  Occupational History   Not on file  Tobacco Use   Smoking status: Every Day    Current packs/day: 0.50    Types: Cigarettes   Smokeless tobacco: Never  Vaping Use   Vaping status: Never Used  Substance and Sexual Activity   Alcohol use: Yes    Comment: occ   Drug use: Yes    Types: Cocaine,  Benzodiazepines, Marijuana    Comment: last used 06/02/18   Sexual activity: Yes    Birth control/protection: None  Other Topics Concern   Not on file  Social History Narrative   Not on file   Social Determinants of Health   Financial Resource Strain: Not on file  Food Insecurity: No Food Insecurity (01/06/2023)   Hunger Vital Sign    Worried About Running Out of Food in the Last Year: Never true    Ran Out of Food in the Last Year: Never true  Transportation Needs: No Transportation Needs (01/06/2023)   PRAPARE - Administrator, Civil Service (Medical): No    Lack of Transportation (Non-Medical): No  Physical Activity: Not on file  Stress: Not on file  Social Connections: Not on file  Intimate Partner Violence: Not At Risk (01/06/2023)   Humiliation, Afraid, Rape, and Kick questionnaire    Fear of Current or Ex-Partner: No    Emotionally Abused: No    Physically Abused: No    Sexually Abused: No    Family History:  Family History  Problem Relation Age of Onset   Diabetes Mother    Hypertension Mother     Medications:   Current Outpatient Medications on File Prior to Visit  Medication Sig Dispense Refill   apixaban (ELIQUIS) 5 MG TABS tablet Take 1 tablet (5 mg total) by mouth 2 (two) times daily. 130 tablet 0   benzonatate (TESSALON) 100 MG capsule Take 1 capsule (100 mg total) by mouth 3 (three) times daily as needed. 30 capsule 0   cyanocobalamin 1000 MCG tablet Take 1 tablet (1000 mcg total) by mouth daily. 130 tablet 0   ferrous sulfate 325 (65 FE) MG tablet Take 1 tablet (325 mg total) by mouth daily with breakfast. 100 tablet 0   ondansetron (ZOFRAN-ODT) 4 MG disintegrating tablet Take 1 tablet (4 mg total) by mouth every 8 (eight) hours as needed for nausea. 10 tablet 0   pyridOXINE (B-6) 50 MG tablet Take 1 tablet (50 mg total) by mouth daily. 100 tablet 0   apixaban (ELIQUIS) 5 MG TABS tablet Take 2 tablets (10 mg total) by mouth 2 (two) times daily for 6  days. Then take 1 tablet (5mg ) twice daily. 70 tablet 0   naltrexone (DEPADE) 50 MG tablet Take by mouth daily. (Patient not taking: Reported on 03/10/2023)     norethindrone (AYGESTIN) 5 MG tablet Take 1 tablet (5 mg total) by mouth daily. 30 tablet 0   No current facility-administered medications on file prior to visit.    Allergies:   Allergies  Allergen Reactions   Gentamicin Rash   Penicillins Rash      OBJECTIVE:  Physical Exam  Vitals:   03/10/23 0913  BP: 114/76  Pulse: 74  Weight: 125 lb (56.7 kg)  Height: 5\' 3"  (1.6 m)   Body mass index is 22.14 kg/m. No results found.  General: well  developed, well nourished, pleasant young Caucasian female, seated, in no evident distress Head: head normocephalic and atraumatic.   Neck: supple with no carotid or supraclavicular bruits Cardiovascular: regular rate and rhythm, no murmurs Musculoskeletal: no deformity Skin:  no rash/petichiae Vascular:  Normal pulses all extremities   Neurologic Exam Mental Status: Awake and fully alert. Fluent speech and language. Oriented to place and time. Recent and remote memory intact. Attention span, concentration and fund of knowledge appropriate. Mood and affect appropriate.  Cranial Nerves: Fundoscopic exam reveals sharp disc margins. Pupils equal, briskly reactive to light. Extraocular movements full without nystagmus. Visual fields full to confrontation. Hearing intact. Facial sensation intact. Face, tongue, palate moves normally and symmetrically.  Motor: Normal bulk and tone. Normal strength in all tested extremity muscles Sensory.: intact to touch , pinprick , position and vibratory sensation.  Coordination: Rapid alternating movements normal in all extremities. Finger-to-nose and heel-to-shin performed accurately bilaterally. Gait and Station: Arises from chair without difficulty. Stance is normal. Gait demonstrates normal stride length and balance without use of AD. Tandem walk and  heel toe without difficulty.  Reflexes: 1+ and symmetric. Toes downgoing.         ASSESSMENT: BLAYNE BEARDSLEE is a 32 y.o. 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 Vascular risk factors include polysubstance abuse (cocaine, opiates, benzos, THC, EtOH, tobacco), and hyperhomocysteinemia.      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 Can f/u with PCP if further assistance is needed  Menorrhagia  Some worsening on Eliquis - will check CBC today Advised if symptoms worsen, would recommend evaluation by OBGYN/PCP     Follow up in 6 months or call earlier if needed    CC:  [facility] provider: Dr. Pearlean Brownie PCP: Chrys Racer, MD    I spent 59 minutes of face-to-face and non-face-to-face time with patient.  This included previsit chart review including review of recent hospitalization, lab review, study review, order entry, electronic health record documentation, patient education regarding recent stroke including etiology, secondary stroke prevention measures and  importance of managing stroke risk factors, residual deficits and typical recovery time and answered all other questions to patient satisfaction  Ihor Austin, AGNP-BC  [facility] [address] [place], [place] [address]  Phone [phone] Fax [phone] Note: This document was prepared with digital dictation and possible smart phrase technology. Any transcriptional errors that result from this process are unintentional.

## 2023-03-10 ENCOUNTER — Encounter: Payer: Self-pay | Admitting: Adult Health

## 2023-03-10 ENCOUNTER — Ambulatory Visit (INDEPENDENT_AMBULATORY_CARE_PROVIDER_SITE_OTHER): Payer: Medicaid Other | Admitting: Adult Health

## 2023-03-10 VITALS — BP 114/76 | HR 74 | Ht 63.0 in | Wt 125.0 lb

## 2023-03-10 DIAGNOSIS — G441 Vascular headache, not elsewhere classified: Secondary | ICD-10-CM | POA: Diagnosis not present

## 2023-03-10 DIAGNOSIS — F1990 Other psychoactive substance use, unspecified, uncomplicated: Secondary | ICD-10-CM | POA: Diagnosis not present

## 2023-03-10 DIAGNOSIS — N920 Excessive and frequent menstruation with regular cycle: Secondary | ICD-10-CM

## 2023-03-10 DIAGNOSIS — R7983 Abnormal findings of blood amino-acid level: Secondary | ICD-10-CM | POA: Diagnosis not present

## 2023-03-10 DIAGNOSIS — G08 Intracranial and intraspinal phlebitis and thrombophlebitis: Secondary | ICD-10-CM | POA: Diagnosis not present

## 2023-03-10 MED ORDER — TOPIRAMATE 50 MG PO TABS
50.0000 mg | ORAL_TABLET | Freq: Every day | ORAL | 5 refills | Status: DC
Start: 2023-03-10 — End: 2023-06-03

## 2023-03-10 MED ORDER — APIXABAN 5 MG PO TABS: 5.0000 mg | ORAL_TABLET | Freq: Two times a day (BID) | ORAL | 5 refills | Status: DC

## 2023-03-10 MED ORDER — FOLIC ACID 1 MG PO TABS
1.0000 mg | ORAL_TABLET | Freq: Every day | ORAL | 5 refills | Status: DC
Start: 2023-03-10 — End: 2023-06-23

## 2023-03-10 NOTE — Patient Instructions (Addendum)
Your Plan:  Will plan on repeat imaging next month to follow up on venous thrombosis - you will be called to get this scheduled  Continue Eliquis for now - will consider ongoing need after completion of above imaging  Recommend starting topamax 25mg  nightly for 1 week then increase to 50mg  nightly - please call after 2 weeks if no benefit for dosage increase or sooner if difficulty tolerating   We will check lab work today - you will be notified via MyChart tomorrow regarding results and if further medication adjustments need to be made  Continue to refrain from drug use and excessive alcohol use as this can increase risk of further clots and increased stroke risk      Follow up in 6 months or call earlier if needed      Thank you for coming to see [place] at [facility]. I hope we have been able to provide you high quality care today.  You may receive a patient satisfaction survey over the next few weeks. We would appreciate your feedback and comments so that we may continue to improve ourselves and the health of our patients.

## 2023-03-11 LAB — IRON,TIBC AND FERRITIN PANEL
Ferritin: 57 ng/mL (ref 15–150)
Iron Saturation: 29 % (ref 15–55)
Iron: 90 ug/dL (ref 27–159)
Total Iron Binding Capacity: 309 ug/dL (ref 250–450)
UIBC: 219 ug/dL (ref 131–425)

## 2023-03-11 LAB — CBC
Hematocrit: 39.4 % (ref 34.0–46.6)
Hemoglobin: 12.8 g/dL (ref 11.1–15.9)
MCH: 32.6 pg (ref 26.6–33.0)
MCHC: 32.5 g/dL (ref 31.5–35.7)
MCV: 100 fL — ABNORMAL HIGH (ref 79–97)
Platelets: 303 10*3/uL (ref 150–450)
RBC: 3.93 x10E6/uL (ref 3.77–5.28)
RDW: 14.1 % (ref 11.7–15.4)
WBC: 8.2 10*3/uL (ref 3.4–10.8)

## 2023-03-11 LAB — B12 AND FOLATE PANEL
Folate: 2.8 ng/mL — ABNORMAL LOW (ref >3.0)
Vitamin B-12: 490 pg/mL (ref 232–1245)

## 2023-03-11 LAB — HOMOCYSTEINE: Homocysteine: 24.9 umol/L — ABNORMAL HIGH (ref 0.0–14.5)

## 2023-04-01 ENCOUNTER — Other Ambulatory Visit: Payer: Self-pay | Admitting: Adult Health

## 2023-04-01 DIAGNOSIS — Z419 Encounter for procedure for purposes other than remedying health state, unspecified: Secondary | ICD-10-CM | POA: Diagnosis not present

## 2023-04-01 DIAGNOSIS — G08 Intracranial and intraspinal phlebitis and thrombophlebitis: Secondary | ICD-10-CM

## 2023-05-02 DIAGNOSIS — Z419 Encounter for procedure for purposes other than remedying health state, unspecified: Secondary | ICD-10-CM | POA: Diagnosis not present

## 2023-05-07 ENCOUNTER — Telehealth: Payer: Self-pay | Admitting: Adult Health

## 2023-05-07 NOTE — Telephone Encounter (Signed)
Called the mom back and she was able to have her daughter on 3 way call.  The patient states that they have been giving her the medication sometimes every 8-10 hrs. She states that she has been having nose bleeds and coughing up blood. She was told that if she had bleeding she needed to call the MD office. Pt states they told her that she is bleeding now because she was not taking her medication at home like she was suppose to and I clarified twice with the patient and she verbally told me that she was taking her med as prescribed prior to going to jail. I advised that per Shanda Bumps, NP if she is actively bleeding then yes she would need to go to the ER. She asked that I call and discuss this with Alvino Chapel in the jail.   I called [facility] and spoke with their nursing supervisor, Alvino Chapel. She states the patient has put multiple calls into them needing to be evaluated for blood and that she is refusing to show the evidence of the bleeding. She admitted to Alvino Chapel that she had not been taking her medication as prescribed prior to going to jail despite what she had verbalized to me. She states that there is a doctor available there and they have been documenting as on their end in regard to the care of the patient. If at any point the patient does begin to have bleeding that is continous and needs assessing their staff will assess and make sure that the proper care is obtained but as of this moment the patient has yet to provide evidence of the bleeding. She states that they are giving her medication ever 12 hrs and not 8-10 as pt mentioned. I advised I would document on my end that this took place. She states that if we need to request records from them, we can reach out and they would be happy to provide.

## 2023-05-07 NOTE — Telephone Encounter (Signed)
Pt's mother, Raynelle Bring calling in regards to pt having nose bleeds and throwing up blood. Ms. Hurley Cisco said pt is in jail at [facility]. Jailhouse nurse giving medication for shrinking of blood clot every 8 to 10 hours, but suppose to be given every 12 hours. Asking if NP could call the jailhouse to discuss with jailhouse nurse about the medication. Pt's mother did not know the name of the medication.

## 2023-06-01 DIAGNOSIS — Z419 Encounter for procedure for purposes other than remedying health state, unspecified: Secondary | ICD-10-CM | POA: Diagnosis not present

## 2023-06-03 ENCOUNTER — Ambulatory Visit (INDEPENDENT_AMBULATORY_CARE_PROVIDER_SITE_OTHER): Payer: Medicaid Other | Admitting: Adult Health

## 2023-06-03 ENCOUNTER — Encounter: Payer: Self-pay | Admitting: Adult Health

## 2023-06-03 VITALS — BP 107/70 | HR 97 | Ht 63.0 in | Wt 136.4 lb

## 2023-06-03 DIAGNOSIS — E559 Vitamin D deficiency, unspecified: Secondary | ICD-10-CM | POA: Diagnosis not present

## 2023-06-03 DIAGNOSIS — G08 Intracranial and intraspinal phlebitis and thrombophlebitis: Secondary | ICD-10-CM

## 2023-06-03 DIAGNOSIS — E538 Deficiency of other specified B group vitamins: Secondary | ICD-10-CM | POA: Diagnosis not present

## 2023-06-03 DIAGNOSIS — E7211 Homocystinuria: Secondary | ICD-10-CM | POA: Diagnosis not present

## 2023-06-03 DIAGNOSIS — R200 Anesthesia of skin: Secondary | ICD-10-CM

## 2023-06-03 DIAGNOSIS — R202 Paresthesia of skin: Secondary | ICD-10-CM | POA: Diagnosis not present

## 2023-06-03 DIAGNOSIS — R252 Cramp and spasm: Secondary | ICD-10-CM

## 2023-06-03 MED ORDER — TOPIRAMATE 50 MG PO TABS: 50.0000 mg | ORAL_TABLET | Freq: Every day | ORAL | 5 refills | Status: DC

## 2023-06-03 MED ORDER — BUSPIRONE HCL 10 MG PO TABS: 10.0000 mg | ORAL_TABLET | Freq: Two times a day (BID) | ORAL | 5 refills | Status: DC

## 2023-06-03 NOTE — Patient Instructions (Addendum)
Your Plan:  Start topiramate 50mg  PO nightly for headache prevention - please let me know after 2-3 weeks if headaches persist for dosage increase  Restart buspar 10mg  twice daily - can help with anxiety which can also help with your headaches   Needs to schedule repeat CT venogram - please contact [facility] at [phone]  Continue Eliquis 5mg  twice daily - if repeat CT venogram shows resolution of clot, we can stop Eliquis but will keep you updated. If nose bleeds persist, would recommend evaluation by ENT but would not recommend stopping Eliquis until we can repeat imaging  Unclear cause of hand symptoms, we will check lab work today to rule out reversible causes. We will call with any concerning findings     Follow up in 6 months or call earlier if needed     Thank you for coming to see [place] at [facility]. I hope we have been able to provide you high quality care today.  You may receive a patient satisfaction survey over the next few weeks. We would appreciate your feedback and comments so that we may continue to improve ourselves and the health of our patients.

## 2023-06-03 NOTE — Progress Notes (Signed)
[facility] [address] [place]. [place] [address] [phone]       STROKE FOLLOW UP NOTE  [name] Date of Birth:  1990-12-13 Medical Record Number:  [id number]    Primary neurologist: Dr. Pearlean Brownie Reason for visit: Headaches, nosebleed    SUBJECTIVE:   CHIEF COMPLAINT:  Worsening headaches, onset of nosebleeds    HPI:   Update 06/03/2023 JM: Patient returns for sooner follow-up visit with concern of nose bleeds over the past 3 months and worsening headaches.  She is currently incarcerated at [facility] detention center since 10/1 and accompanied by officer.  Patients mother called office on 11/6 reporting nosebleeds and throwing up blood, nurse spoke with jail nurse who reports these concerns have been evaluated multiple times and refusing to show evidence of bleeding, she apparently was not taking her Eliquis correctly prior to incarceration, although per patient she was taking correctly and facility giving her Eliquis every 8-10 hrs, facility conformed they were administering Eliquis every 12 hours, they planned to continue to monitor and would pursue further evaluation if indicated.   Currently, she reports continued nosebleeds, counts about 6 total since October, once she spit up blood. Nosebleeds do not last long. She reports currently receiving Eliquis correctly every 12 hours. She reports worsening headache since mid October as she reports facility stopped giving her her medications for 2 weeks as they accused her of lying about taking her medications (again, per patient report). Upon further discussion, she never actually started topiramate prior to incarceration as she was not able to afford therefore it was never started at facility as she was not actively taking prior. She feels her headaches have worsened over the past month, similar to how she felt when she was first found to have CVST. Can have blurred vision with more severe  headache, denies vision loss or double vision.  Denies any weakness or speech changes. Does report intermittent bilateral hand numbness and cramping/stiffness in the middle of the night, feels like her heart is racing, she was told due to anxiety as her [name] was stopped upon incarceration for unclear reason. CTV ordered on 10/1 but has not been able to be scheduled as she has been incarcerated.  She does report ongoing use of iron, folic acid and B vitamins.     History provided for reference purposes only Initial visit 03/10/2023 JM: Being seen for hospital follow up unaccompanied. She has been doing well overall since discharge but continues to experience headaches but gradually improving since discharge, located top of head and occipital, about 2-3 per week, will use tylenol, occasionally beneficial and will resolve after 1 hr but when Tylenol not beneficial, will have to sit in dark room, will have associated photophobia, denies phonophobia or N/V. Was having frequent headaches that started back in 07/2021, no prior treatment.  Denies new stroke/TIA symptoms.  Continues to Eliquis, does have hx of menorrhagia, slightly worsened on Eliquis, does not follow with OBGYN. Has seen PCP since discharge and f/u scheduled 9/19. Reports some labs completed but unsure which ones were completed.  She continues to take B12, folic acid and iron supplements.  Denies any additional drug use since discharge. Has had a couple beers since discharge but denies excessive use.  Continue tobacco use although gradually reducing amount, currently 0.5 PPD, prior over 1 PPD, goal is to eventually quit.  Stroke admission 01/06/2023 [name] is a 32 y.o. female with history of recent concussion, polysubstance abuse,  alcohol abuse, tobacco abuse who presented to ED on 01/06/2023 with persistent headache. CTH and CTV showed occlusive dural venous sinus thrombosis extending from the torcula to the left IJ and including  the entire left transverse and sigmoid sinuses.  MRI no acute process.  Placed on IV heparin and transition to Eliquis, recommended hematology consult for [facility] duration.  UDS positive for opioids, cocaine, benzos and THC.  Hypercoagulable panel largely normal except homocystine 54.8 likely related to smoking, started B12, folate and B6.  Polysubstance abuse cessation discussed.   PERTINENT IMAGING  CT head High-density appearance at the left transverse and sigmoid dural sinuses CTV-Occlusive dural venous sinus thrombosis extending from the torcula to the left IJ and including the entire left transverse and sigmoid sinuses. No brain edema or hemorrhage. 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   Chronic headache    Cocaine abuse (HCC)    Concussion    Marijuana abuse    Nicotine dependence    Opioid use disorder    Polysubstance abuse (HCC)    Smoker     PSH:  Past Surgical History:  Procedure Laterality Date   APPENDECTOMY      Social History:  Social History   Socioeconomic History   Marital status: Single    Spouse name: Not on file   Number of children: Not on file   Years of education: Not on file   Highest education level: Not on file  Occupational History   Not on file  Tobacco Use   Smoking status: Every Day    Current packs/day: 0.50    Types: Cigarettes   Smokeless tobacco: Never  Vaping Use   Vaping status: Never Used  Substance and Sexual Activity   Alcohol use: Yes    Comment: occ   Drug use: Yes    Types: Cocaine, Benzodiazepines, Marijuana    Comment:  last used 06/02/18   Sexual activity: Yes    Birth control/protection: None  Other Topics Concern   Not on file  Social History Narrative   Not on file   Social Determinants of Health   Financial Resource Strain: Not on file  Food Insecurity: No Food Insecurity (01/06/2023)   Hunger Vital Sign    Worried About Running Out of Food in the Last Year: Never true    Ran Out of Food in the Last Year: Never true  Transportation Needs: No Transportation Needs (01/06/2023)   PRAPARE - Administrator, Civil Service (Medical): No    Lack of Transportation (Non-Medical): No  Physical Activity: Not on file  Stress: Not on file  Social Connections: Not on file  Intimate Partner Violence: Not At Risk (01/06/2023)   Humiliation, Afraid, Rape, and Kick questionnaire    Fear of Current or Ex-Partner: No    Emotionally Abused: No    Physically Abused: No    Sexually Abused: No    Family History:  Family History  Problem Relation Age of Onset   Diabetes Mother    Hypertension Mother     Medications:  Current Outpatient Medications on File Prior to Visit  Medication Sig Dispense Refill   apixaban (ELIQUIS) 5 MG TABS tablet Take 1 tablet (5 mg total) by mouth 2 (two) times daily. 60 tablet 5   benzonatate (TESSALON) 100 MG capsule Take 1 capsule (100 mg total) by mouth 3 (three) times daily as needed. 30 capsule 0   folic acid (FOLVITE) 1 MG tablet Take 1 tablet (1 mg total) by mouth daily. 30 tablet 5   ondansetron (ZOFRAN-ODT) 4 MG disintegrating tablet Take 1 tablet (4 mg total) by mouth every 8 (eight) hours as needed for nausea. 10 tablet 0   topiramate (TOPAMAX) 50 MG tablet Take 1 tablet (50 mg total) by mouth at bedtime. 30 tablet 5   ferrous sulfate 325 (65 FE) MG tablet Take 1 tablet (325 mg total) by mouth daily with breakfast. 100 tablet 0   naltrexone (DEPADE) 50 MG tablet Take by mouth daily. (Patient not taking: Reported on 03/10/2023)     norethindrone (AYGESTIN) 5 MG  tablet Take 1 tablet (5 mg total) by mouth daily. 30 tablet 0   No current facility-administered medications on file prior to visit.    Allergies:   Allergies  Allergen Reactions   Gentamicin Rash   Penicillins Rash      OBJECTIVE:  Physical Exam  Vitals:   06/03/23 1352  BP: 107/70  Pulse: 97  Weight: 136 lb 6.4 oz (61.9 kg)  Height: 5\' 3"  (1.6 m)   Body mass index is 24.16 kg/m. No results found.  General: well developed, well nourished, pleasant young Caucasian female, seated, in no evident distress  Neurologic Exam Mental Status: Awake and fully alert. Fluent speech and language. Oriented to place and time. Recent and remote memory intact. Attention span, concentration and fund of knowledge appropriate. Mood and affect appropriate.  Cranial Nerves: Pupils equal, briskly reactive to light. Extraocular movements full without nystagmus. Visual fields full to confrontation. Hearing intact. Facial sensation intact. Face, tongue, palate moves normally and symmetrically.  Motor: Normal bulk and tone. Normal strength in all tested extremity muscles although testing limited as she is in hand and ankle cuffs) Sensory.: intact to touch , pinprick , position and vibratory sensation.  Coordination: Rapid alternating movements normal in all extremities. Finger-to-nose and heel-to-shin unable to test Gait and Station: Arises from chair without difficulty. Stance is normal. Gait demonstrates normal stride length and balance without use of AD.  Reflexes: 1+ and symmetric. Toes downgoing.         ASSESSMENT: [name] is a 32 y.o. 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 Vascular risk factors include polysubstance abuse (cocaine, opiates, benzos, THC, EtOH, tobacco), and hyperhomocysteinemia.  Returns today with worsening headaches and frequent  nosebleeds.     PLAN:  CVST :  Worsening headaches - repeat CTV ordered in October but not yet scheduled as she is incarcerated. Provided GI number to jail to call and schedule ASAP in setting of worsening headaches Recommend starting topiramate 50 mg nightly as previously advised Possible worsening headaches from abruptly stopping [name] - recommend restarting at 10mg  BID Continue Eliquis 5mg  twice daily, based on repeat CTV, may be able to stop but advised to continue for now. May benefit from ENT evaluation in setting of recent nose bleeds. 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  This included previsit chart review, lab review, study review, order entry, electronic health record documentation, patient education and discussion regarding above diagnoses and treatment plan and answered all other questions to patient's satisfaction   Ihor Austin, [facility]  [facility] [address] [place], [place] [address]  Phone [phone] Fax [phone] Note: This document was prepared with digital dictation and possible smart phrase technology. Any transcriptional errors that result from this process are  unintentional.

## 2023-06-04 LAB — BASIC METABOLIC PANEL
BUN/Creatinine Ratio: 24 — ABNORMAL HIGH (ref 9–23)
BUN: 12 mg/dL (ref 6–20)
CO2: 21 mmol/L (ref 20–29)
Calcium: 9.4 mg/dL (ref 8.7–10.2)
Chloride: 103 mmol/L (ref 96–106)
Creatinine, Ser: 0.5 mg/dL — ABNORMAL LOW (ref 0.57–1.00)
Glucose: 89 mg/dL (ref 70–99)
Potassium: 4.4 mmol/L (ref 3.5–5.2)
Sodium: 138 mmol/L (ref 134–144)
eGFR: 128 mL/min/{1.73_m2} (ref >59)

## 2023-06-04 LAB — TSH: TSH: 1.97 u[IU]/mL (ref 0.450–4.500)

## 2023-06-04 LAB — CBC
Hematocrit: 35.7 % (ref 34.0–46.6)
Hemoglobin: 11.8 g/dL (ref 11.1–15.9)
MCH: 32.4 pg (ref 26.6–33.0)
MCHC: 33.1 g/dL (ref 31.5–35.7)
MCV: 98 fL — ABNORMAL HIGH (ref 79–97)
Platelets: 303 10*3/uL (ref 150–450)
RBC: 3.64 x10E6/uL — ABNORMAL LOW (ref 3.77–5.28)
RDW: 12 % (ref 11.7–15.4)
WBC: 8.5 10*3/uL (ref 3.4–10.8)

## 2023-06-04 LAB — MAGNESIUM: Magnesium: 2.2 mg/dL (ref 1.6–2.3)

## 2023-06-04 LAB — B12 AND FOLATE PANEL
Folate: >20 ng/mL (ref >3.0)
Vitamin B-12: 667 pg/mL (ref 232–1245)

## 2023-06-04 LAB — HOMOCYSTEINE: Homocysteine: 5.5 umol/L (ref 0.0–14.5)

## 2023-06-04 LAB — VITAMIN D 25 HYDROXY (VIT D DEFICIENCY, FRACTURES): Vit D, 25-Hydroxy: 18.6 ng/mL — ABNORMAL LOW (ref 30.0–100.0)

## 2023-06-05 ENCOUNTER — Telehealth: Payer: Self-pay | Admitting: *Deleted

## 2023-06-05 NOTE — Telephone Encounter (Signed)
Spoke with Alvino Chapel, RN from [facility]. She questioned need of repeat "CT scan" due to patient having a headache. Attempted to advise her of patients history with CVST that occurred in 12/2022 and placed on Eliquis at that time, it was planned to repeat CT venogram (CTV) in 3 months (04/2023) to reevaluate and if resolved, could stop Eliquis but imaging never completed as she has been incarcerated since 10/1. As patient was complaining of worsening headaches (similar to headache that occurred in July), it was highly recommended to repeat the CTV at that time, especially as there was some concern of Eliquis compliance prior to incarceration. Patient also c/o frequent nose bleeds, it was also discussed that depending on CTV results, Eliquis could possibly be discontinued but if Eliquis needed to be continued, it would be recommended she be evaluated by ENT.  RN stated she was aware of patients history but the MD at the jail did not see the reason for needing any repeat imaging and patient would need to have imaging completed after she was released. Again, attempted to explain importance of having this imaging completed but was interrupted by RN saying that ultimately the jail MD would make the final decision on the treatment plan for the patient. RN reluctantly stated that she would have the MD call this office to further discuss, she was provided with office phone number and then RN abruptly ended call.

## 2023-06-05 NOTE — Telephone Encounter (Signed)
Spoke to Jo,RN at [facility]  pt is inmate there  Gave RN labwork results and Jessica,NP recommendation. RN pushed back on pt receiving her CT Venogram  RN states pt can get test when she is released from jail. RN questioned why patient needed test right now . RN asked to speak to Jessica,NP. Jessica,NP came to phone to speak to RN

## 2023-06-05 NOTE — Telephone Encounter (Signed)
Noted  

## 2023-06-05 NOTE — Telephone Encounter (Signed)
-----   Message from Ihor Austin sent at 06/04/2023  4:48 PM EST ----- Patient currently incarcerated at [facility] jail - please contact jail nurse to advise her that vit D was low and will need supplement - recommend Vit D 1000 units daily. All other labs stable, continue current treatment regimen. Please also ensure they have been able to contact [facility] imaging to schedule CT venogram which should be scheduled ASAP in setting of worsening headaches. thank you.

## 2023-06-20 ENCOUNTER — Telehealth: Payer: Self-pay | Admitting: Adult Health

## 2023-06-20 NOTE — Telephone Encounter (Signed)
Pt requesting all current medications refill to be sent to [id number]   Pt was incarcerated from 04/01/23 - yesterday and doesn't know all the medications names she is prescribed. Also has questions about CT scan that was never done. Requesting call back

## 2023-06-22 ENCOUNTER — Other Ambulatory Visit: Payer: Self-pay | Admitting: Student

## 2023-06-23 ENCOUNTER — Other Ambulatory Visit: Payer: Self-pay

## 2023-06-23 ENCOUNTER — Other Ambulatory Visit (HOSPITAL_COMMUNITY): Payer: Self-pay

## 2023-06-23 ENCOUNTER — Other Ambulatory Visit (HOSPITAL_BASED_OUTPATIENT_CLINIC_OR_DEPARTMENT_OTHER): Payer: Self-pay

## 2023-06-23 MED ORDER — TOPIRAMATE 50 MG PO TABS: 50.0000 mg | ORAL_TABLET | Freq: Every day | ORAL | 5 refills | Status: DC

## 2023-06-23 MED ORDER — BUSPIRONE HCL 10 MG PO TABS: 10.0000 mg | ORAL_TABLET | Freq: Two times a day (BID) | ORAL | 5 refills | Status: DC

## 2023-06-23 MED ORDER — BUSPIRONE HCL 10 MG PO TABS: 10.0000 mg | ORAL_TABLET | Freq: Two times a day (BID) | ORAL | 5 refills | Status: AC

## 2023-06-23 MED ORDER — FOLIC ACID 1 MG PO TABS: 1.0000 mg | ORAL_TABLET | Freq: Every day | ORAL | 5 refills | Status: AC

## 2023-06-23 MED ORDER — APIXABAN 5 MG PO TABS: 5.0000 mg | ORAL_TABLET | Freq: Two times a day (BID) | ORAL | 5 refills | Status: DC

## 2023-06-23 NOTE — Telephone Encounter (Signed)
[name]: [id number] exp. 06/23/23-08/22/23 sent to GI [phone]

## 2023-06-24 ENCOUNTER — Other Ambulatory Visit: Payer: Self-pay

## 2023-07-02 DIAGNOSIS — Z419 Encounter for procedure for purposes other than remedying health state, unspecified: Secondary | ICD-10-CM | POA: Diagnosis not present

## 2023-07-15 ENCOUNTER — Ambulatory Visit
Admission: RE | Admit: 2023-07-15 | Discharge: 2023-07-15 | Disposition: A | Discharge status: Home / Self Care | Payer: Medicaid Other | Source: Ambulatory Visit | Attending: Adult Health

## 2023-07-15 DIAGNOSIS — G08 Intracranial and intraspinal phlebitis and thrombophlebitis: Secondary | ICD-10-CM

## 2023-07-15 DIAGNOSIS — Z09 Encounter for follow-up examination after completed treatment for conditions other than malignant neoplasm: Secondary | ICD-10-CM | POA: Diagnosis not present

## 2023-07-15 DIAGNOSIS — I676 Nonpyogenic thrombosis of intracranial venous system: Secondary | ICD-10-CM | POA: Diagnosis not present

## 2023-07-15 MED ORDER — IOPAMIDOL (ISOVUE-370) INJECTION 76%
200.0000 mL | Freq: Once | INTRAVENOUS | Status: AC | PRN
  Administered 2023-07-15: 75 mL via INTRAVENOUS

## 2023-07-25 ENCOUNTER — Encounter: Payer: Self-pay | Admitting: Adult Health

## 2023-07-30 ENCOUNTER — Other Ambulatory Visit: Payer: Self-pay | Admitting: Student

## 2023-07-30 MED ORDER — TOPIRAMATE 50 MG PO TABS: 50.0000 mg | ORAL_TABLET | Freq: Two times a day (BID) | ORAL | 5 refills | Status: DC

## 2023-07-31 NOTE — Telephone Encounter (Signed)
NOT [facility] PATIENT

## 2023-08-02 DIAGNOSIS — Z419 Encounter for procedure for purposes other than remedying health state, unspecified: Secondary | ICD-10-CM | POA: Diagnosis not present

## 2023-08-18 ENCOUNTER — Ambulatory Visit: Payer: Medicaid Other | Admitting: Family Medicine

## 2023-08-30 DIAGNOSIS — Z419 Encounter for procedure for purposes other than remedying health state, unspecified: Secondary | ICD-10-CM | POA: Diagnosis not present

## 2023-09-08 ENCOUNTER — Encounter: Payer: Self-pay | Admitting: Family Medicine

## 2023-09-08 ENCOUNTER — Ambulatory Visit: Payer: Medicaid Other | Admitting: Family Medicine

## 2023-09-08 VITALS — BP 102/66 | HR 89 | Temp 98.6°F | Ht 63.0 in | Wt 131.4 lb

## 2023-09-08 DIAGNOSIS — N920 Excessive and frequent menstruation with regular cycle: Secondary | ICD-10-CM

## 2023-09-08 DIAGNOSIS — Z0001 Encounter for general adult medical examination with abnormal findings: Secondary | ICD-10-CM | POA: Diagnosis not present

## 2023-09-08 DIAGNOSIS — G08 Intracranial and intraspinal phlebitis and thrombophlebitis: Secondary | ICD-10-CM | POA: Diagnosis not present

## 2023-09-08 DIAGNOSIS — F1910 Other psychoactive substance abuse, uncomplicated: Secondary | ICD-10-CM

## 2023-09-08 DIAGNOSIS — E559 Vitamin D deficiency, unspecified: Secondary | ICD-10-CM

## 2023-09-08 DIAGNOSIS — Z0000 Encounter for general adult medical examination without abnormal findings: Secondary | ICD-10-CM

## 2023-09-08 MED ORDER — ONDANSETRON 4 MG PO TBDP
4.0000 mg | ORAL_TABLET | Freq: Three times a day (TID) | ORAL | 0 refills | Status: AC | PRN
Start: 2023-09-08 — End: ?

## 2023-09-08 MED ORDER — VITAMIN D (ERGOCALCIFEROL) 1.25 MG (50000 UNIT) PO CAPS: 50000.0000 [IU] | ORAL_CAPSULE | ORAL | 0 refills | Status: AC

## 2023-09-08 NOTE — Progress Notes (Signed)
 Complete physical exam  Patient: [name]   DOB: [birth year]   33 y.o. Female  MRN: [id number]  Subjective:    Chief Complaint  Patient presents with   Menorrhagia   Annual Exam    [name] is a 33 y.o. female who presents today for a complete physical exam. She reports consuming a general diet.  She does some home work out sometimes.  She generally feels well. She reports sleeping well. She does have additional problems to discuss today.   Reports nausea and vomiting with her cycles since she was a teenager. Reprots heavy cycles. Cycles are regular, last 3-4 days. Heavy bleeding 1-2 days. Having to change super tampon every 3 hours on those days. She does pass some clots. Has a lot of pain and cramping with cycles. Currently on eliqus for thrombosis This is managed by neurology.    Most recent fall risk assessment:    09/08/2023    2:53 PM  Fall Risk   Falls in the past year? 0     Most recent depression screenings:    09/08/2023    2:54 PM 04/19/2016    2:13 PM  Depression screen PHQ 2/9  Decreased Interest 0 0  Down, Depressed, Hopeless 0 0  PHQ - 2 Score 0 0  Altered sleeping 1   Tired, decreased energy 0   Change in appetite 0   Feeling bad or failure about yourself  0   Trouble concentrating 0   Moving slowly or fidgety/restless 0   Suicidal thoughts 0   PHQ-9 Score 1   Difficult doing work/chores Not difficult at all       09/08/2023    2:54 PM  GAD 7 : Generalized Anxiety Score  Nervous, Anxious, on Edge 1  Control/stop worrying 0  Worry too much - different things 0  Trouble relaxing 1  Restless 0  Easily annoyed or irritable 1  Afraid - awful might happen 0  Total GAD 7 Score 3  Anxiety Difficulty Somewhat difficult      Vision:Not within last year  and Dental: No current dental problems and No regular dental care   Past Medical History:  Diagnosis Date   Alcohol abuse    Anxiety    Cerebral venous sinus thrombosis  01/06/2023   Chronic headache    Clotting disorder (HCC)    Cocaine abuse (HCC)    Concussion    Marijuana abuse    Nicotine dependence    Opioid use disorder    Polysubstance abuse (HCC)    Smoker       Patient Care Team: Rachel Earing, FNP as PCP - General (Family Medicine)   Outpatient Medications Prior to Visit  Medication Sig   apixaban (ELIQUIS) 5 MG TABS tablet Take 1 tablet (5 mg total) by mouth 2 (two) times daily.   busPIRone ([name]) 10 MG tablet Take 1 tablet (10 mg total) by mouth 2 (two) times daily.   folic acid (FOLVITE) 1 MG tablet Take 1 tablet (1 mg total) by mouth daily.   ondansetron (ZOFRAN-ODT) 4 MG disintegrating tablet Take 1 tablet (4 mg total) by mouth every 8 (eight) hours as needed for nausea.   topiramate (TOPAMAX) 50 MG tablet Take 1 tablet (50 mg total) by mouth 2 (two) times daily.   ferrous sulfate 325 (65 FE) MG tablet Take 1 tablet (325 mg total) by mouth daily with breakfast.   [DISCONTINUED] benzonatate (TESSALON) 100 MG capsule Take 1  capsule (100 mg total) by mouth 3 (three) times daily as needed.   [DISCONTINUED] naltrexone (DEPADE) 50 MG tablet Take by mouth daily.   [DISCONTINUED] norethindrone (AYGESTIN) 5 MG tablet Take 1 tablet (5 mg total) by mouth daily.   No facility-administered medications prior to visit.    ROS Negative unless specially indicated above in HPI.       Objective:     BP 102/66   Pulse 89   Temp 98.6 F (37 C) (Temporal)   Ht 5\' 3"  (1.6 m)   Wt 131 lb 6.4 oz (59.6 kg)   SpO2 96%   BMI 23.28 kg/m    Physical Exam Vitals and nursing note reviewed.  Constitutional:      General: She is not in acute distress.    Appearance: Normal appearance. She is not ill-appearing, toxic-appearing or diaphoretic.  HENT:     Head: Normocephalic.     Right Ear: Tympanic membrane, ear canal and external ear normal.     Left Ear: Tympanic membrane, ear canal and external ear normal.     Nose: Nose normal.      Mouth/Throat:     Mouth: Mucous membranes are moist.     Pharynx: Oropharynx is clear.  Eyes:     Extraocular Movements: Extraocular movements intact.     Conjunctiva/sclera: Conjunctivae normal.     Pupils: Pupils are equal, round, and reactive to light.  Cardiovascular:     Rate and Rhythm: Normal rate and regular rhythm.     Pulses: Normal pulses.     Heart sounds: Normal heart sounds. No murmur heard.    No friction rub. No gallop.  Pulmonary:     Effort: Pulmonary effort is normal.     Breath sounds: Normal breath sounds.  Abdominal:     General: Bowel sounds are normal. There is no distension.     Palpations: Abdomen is soft. There is no mass.     Tenderness: There is no abdominal tenderness. There is no guarding.  Musculoskeletal:        General: No swelling or tenderness. Normal range of motion.     Cervical back: Normal range of motion and neck supple. No tenderness.     Right lower leg: No edema.     Left lower leg: No edema.  Skin:    General: Skin is warm and dry.     Capillary Refill: Capillary refill takes less than 2 seconds.     Findings: No lesion or rash.  Neurological:     General: No focal deficit present.     Mental Status: She is alert and oriented to person, place, and time.     Cranial Nerves: No cranial nerve deficit.     Motor: No weakness.     Gait: Gait normal.  Psychiatric:        Mood and Affect: Mood normal.        Behavior: Behavior normal.        Thought Content: Thought content normal.        Judgment: Judgment normal.      No results found for any visits on 09/08/23.     Assessment & Plan:    Routine Health Maintenance and Physical Exam  [name] was seen today for menorrhagia and annual exam.  Diagnoses and all orders for this visit:  Routine general medical examination at a health care facility  Menorrhagia with regular cycle Referral to GYN to discuss treatment options as she has a  hx of thrombosis. Can take zofran prn.  -      Ambulatory referral to Gynecology -     ondansetron (ZOFRAN-ODT) 4 MG disintegrating tablet; Take 1 tablet (4 mg total) by mouth every 8 (eight) hours as needed for nausea.  Dural venous sinus thrombosis On eliquis. Managed by neurology.   Vitamin D deficiency Reviewed recent labs. Vitamin D was low. Not taking supplement. Complete Rx as below. After that, take an daily OTC vitamin D supplement with 1500-2000 IU a day. -     Vitamin D, Ergocalciferol, (DRISDOL) 1.25 MG (50000 UNIT) CAPS capsule; Take 1 capsule (50,000 Units total) by mouth every 7 (seven) days.  Polysubstance abuse (HCC) In remission.    Immunization History  Administered Date(s) Administered   DTaP 09/11/1990, 01/29/1991, 05/12/1991, 01/17/1992   HIB (PRP-OMP) 09/11/1990, 01/29/1991, 05/12/1991, 01/17/1992   Hepatitis B 07/14/2001, 08/24/2001, 12/29/2006   Hpv-Unspecified 12/29/2006, 03/31/2007, 03/08/2008   IPV 09/11/1990, 01/29/1991, 01/17/1992   MMR 01/17/1992   Td (Adult),5 Lf Tetanus Toxid, Preservative Free 09/19/2020   Tdap 04/22/2007    Health Maintenance  Topic Date Due   Cervical Cancer Screening (HPV/Pap Cotest)  Never done   INFLUENZA VACCINE  09/29/2023 (Originally 01/30/2023)   Pneumococcal Vaccine 73-29 Years old (1 of 2 - PCV) 09/07/2024 (Originally 07/13/1996)   COVID-19 Vaccine (1) 09/23/2024 (Originally 07/14/1995)   DTaP/Tdap/Td (7 - Td or Tdap) 09/20/2030   HPV VACCINES  Completed   Hepatitis C Screening  Completed   HIV Screening  Completed    Discussed health benefits of physical activity, and encouraged her to engage in regular exercise appropriate for her age and condition.  Problem List Items Addressed This Visit       Cardiovascular and Mediastinum   Dural venous sinus thrombosis     Other   Menorrhagia - Primary   Relevant Medications   ondansetron (ZOFRAN-ODT) 4 MG disintegrating tablet   Other Relevant Orders   Ambulatory referral to Gynecology   Other Visit  Diagnoses       Routine general medical examination at a health care facility         Vitamin D deficiency       Relevant Medications   Vitamin D, Ergocalciferol, (DRISDOL) 1.25 MG (50000 UNIT) CAPS capsule     Polysubstance abuse (HCC)          Return in about 1 year (around 09/07/2024) for CPE.   The patient indicates understanding of these issues and agrees with the plan.   Rachel Earing, FNP

## 2023-09-08 NOTE — Patient Instructions (Signed)

## 2023-09-09 ENCOUNTER — Other Ambulatory Visit (HOSPITAL_COMMUNITY): Payer: Self-pay

## 2023-09-23 ENCOUNTER — Ambulatory Visit: Payer: Medicaid Other | Admitting: Adult Health

## 2023-10-01 NOTE — Progress Notes (Signed)
 [facility] [address] [place]. [place] [address] [phone]       STROKE FOLLOW UP NOTE  Ms. NADRA HRITZ Date of Birth:  1991/02/27 Medical Record Number:  [id number]    Primary neurologist: Dr. Pearlean Brownie Reason for visit: CVST    SUBJECTIVE:   CHIEF COMPLAINT:  Chief Complaint  Patient presents with   Headache    Rm 8 alone  Pt is well, reports she has about 2-3 headaches in a month.  Denies any nosebleeds.  Pt also mentions interest in being back Naltrexone        HPI:   Update 10/06/2023 JM: Patient returns for follow-up visit.  Previously complained of persistent headaches and recommended initiating topiramate as discussed at prior visits.  She contacted office in January with some improvement of headaches but still present about every other day therefore increase topiramate to 50 mg twice daily.  CTV 07/2023 showed decreased left transverse sinus and left sigmoid sinus clot which is now nonocclusive and prior occlusive thrombus in left IJ no longer seen.  It was recommended she continue on Eliquis for at least 9 to 43-month duration.   Reports improvement of headaches since prior visit, currently experienced about 2 to 3/month with ongoing use of topiramate. Will use Tylenol with headache with some benefit.  No new stroke/TIA symptoms.  Remains on Eliquis without side effects.  Also remains on supplements including vitamin D, B12, B6, iron and folic acid.  She denies any additional drug use since 12/2022 and very minimal amount of EtOH.  She questions restarting naltextron which was started in hospital back in 12/2022 which helped reduce cravings.  Routinely follows with PCP.     History provided for reference purposes only Update 06/03/2023 JM: Patient returns for sooner follow-up visit with concern of nose bleeds over the past 3 months and worsening headaches.  She is currently incarcerated at [facility] detention center since 10/1 and  accompanied by officer.  Patients mother called office on 11/6 reporting nosebleeds and throwing up blood, nurse spoke with jail nurse who reports these concerns have been evaluated multiple times and refusing to show evidence of bleeding, she apparently was not taking her Eliquis correctly prior to incarceration, although per patient she was taking correctly and facility giving her Eliquis every 8-10 hrs, facility conformed they were administering Eliquis every 12 hours, they planned to continue to monitor and would pursue further evaluation if indicated.   Currently, she reports continued nosebleeds, counts about 6 total since October, once she spit up blood. Nosebleeds do not last long. She reports currently receiving Eliquis correctly every 12 hours. She reports worsening headache since mid October as she reports facility stopped giving her her medications for 2 weeks as they accused her of lying about taking her medications (again, per patient report). Upon further discussion, she never actually started topiramate prior to incarceration as she was not able to afford therefore it was never started at facility as she was not actively taking prior. She feels her headaches have worsened over the past month, similar to how she felt when she was first found to have CVST. Can have blurred vision with more severe headache, denies vision loss or double vision.  Denies any weakness or speech changes. Does report intermittent bilateral hand numbness and cramping/stiffness in the middle of the night, feels like her heart is racing, she was told due to anxiety as her [name] was stopped upon incarceration for unclear reason. CTV ordered on  10/1 but has not been able to be scheduled as she has been incarcerated.  She does report ongoing use of iron, folic acid and B vitamins.  Initial visit 03/10/2023 JM: Being seen for hospital follow up unaccompanied. She has been doing well overall since discharge but continues to  experience headaches but gradually improving since discharge, located top of head and occipital, about 2-3 per week, will use tylenol, occasionally beneficial and will resolve after 1 hr but when Tylenol not beneficial, will have to sit in dark room, will have associated photophobia, denies phonophobia or N/V. Was having frequent headaches that started back in 07/2021, no prior treatment.  Denies new stroke/TIA symptoms.  Continues to Eliquis, does have hx of menorrhagia, slightly worsened on Eliquis, does not follow with OBGYN. Has seen PCP since discharge and f/u scheduled 9/19. Reports some labs completed but unsure which ones were completed.  She continues to take B12, folic acid and iron supplements.  Denies any additional drug use since discharge. Has had a couple beers since discharge but denies excessive use.  Continue tobacco use although gradually reducing amount, currently 0.5 PPD, prior over 1 PPD, goal is to eventually quit.  Stroke admission 01/06/2023 Ms. KHRYSTAL JEANMARIE is a 33 y.o. female with history of recent concussion, polysubstance abuse, alcohol abuse, tobacco abuse who presented to ED on 01/06/2023 with persistent headache. CTH and CTV showed occlusive dural venous sinus thrombosis extending from the torcula to the left IJ and including the entire left transverse and sigmoid sinuses.  MRI no acute process.  Placed on IV heparin and transition to Eliquis, recommended hematology consult for [facility] duration.  UDS positive for opioids, cocaine, benzos and THC.  Hypercoagulable panel largely normal except homocystine 54.8 likely related to smoking, started B12, folate and B6.  Polysubstance abuse cessation discussed.   PERTINENT IMAGING  CT head High-density appearance at the left transverse and sigmoid dural sinuses CTV-Occlusive dural venous sinus thrombosis extending from the torcula to the left IJ and including the entire left transverse and sigmoid sinuses. No brain edema or  hemorrhage. 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   Chronic headache    Clotting disorder (HCC)    Cocaine abuse (HCC)    Concussion    Marijuana abuse    Nicotine dependence    Opioid use disorder    Polysubstance abuse (HCC)    Smoker     PSH:  Past Surgical History:  Procedure Laterality Date   APPENDECTOMY      Social History:  Social History   Socioeconomic History   Marital status: Single    Spouse name: Not on file   Number of children: 0   Years of education: Not on file   Highest education level: GED or equivalent  Occupational History   Not on file  Tobacco Use   Smoking status: Every Day    Current packs/day: 0.50    Average packs/day: 0.5 packs/day for 18.3 years (9.1 ttl pk-yrs)    Types: Cigarettes    Start date: 2007  Smokeless tobacco: Never  Vaping Use   Vaping status: Never Used  Substance and Sexual Activity   Alcohol use: Not Currently    Comment: occ   Drug use: Yes    Types: Cocaine, Benzodiazepines, Marijuana    Comment: last used 06/02/18   Sexual activity: Yes    Birth control/protection: None  Other Topics Concern   Not on file  Social History Narrative   Not on file   Social Drivers of Health   Financial Resource Strain: Not on file  Food Insecurity: No Food Insecurity (01/06/2023)   Hunger Vital Sign    Worried About Running Out of Food in the Last Year: Never true    Ran Out of Food in the Last Year: Never true  Transportation Needs: No Transportation Needs (01/06/2023)   PRAPARE - Scientist, research (physical sciences) (Medical): No    Lack of Transportation (Non-Medical): No  Physical Activity: Not on file  Stress: Not on file  Social Connections: Not on file  Intimate Partner Violence: Not At Risk (01/06/2023)   Humiliation, Afraid, Rape, and Kick questionnaire    Fear of Current or Ex-Partner: No    Emotionally Abused: No    Physically Abused: No    Sexually Abused: No    Family History:  Family History  Problem Relation Age of Onset   Cancer Mother        Skin cancer on nose   Anxiety disorder Mother    Diabetes Mother    Hypertension Mother    Alcohol abuse Father    ADD / ADHD Brother    Heart attack Paternal Grandmother     Medications:   Current Outpatient Medications on File Prior to Visit  Medication Sig Dispense Refill   apixaban (ELIQUIS) 5 MG TABS tablet Take 1 tablet (5 mg total) by mouth 2 (two) times daily. 60 tablet 5   busPIRone ([name]) 10 MG tablet Take 1 tablet (10 mg total) by mouth 2 (two) times daily. 60 tablet 5   ferrous sulfate 325 (65 FE) MG tablet Take 1 tablet (325 mg total) by mouth daily with breakfast. 100 tablet 0   folic acid (FOLVITE) 1 MG tablet Take 1 tablet (1 mg total) by mouth daily. 30 tablet 5   ondansetron (ZOFRAN-ODT) 4 MG disintegrating tablet Take 1 tablet (4 mg total) by mouth every 8 (eight) hours as needed for nausea. 10 tablet 0   Vitamin D, Ergocalciferol, (DRISDOL) 1.25 MG (50000 UNIT) CAPS capsule Take 1 capsule (50,000 Units total) by mouth every 7 (seven) days. 12 capsule 0   No current facility-administered medications on file prior to visit.    Allergies:   Allergies  Allergen Reactions   Gentamicin Rash   Penicillins Rash      OBJECTIVE:  Physical Exam  Vitals:   10/06/23 1009  BP: 103/72  Pulse: 70  Weight: 137 lb (62.1 kg)  Height: 5\' 3"  (1.6 m)   Body mass index is 24.27 kg/m. No results found.  General: well developed, well nourished, very pleasant young Caucasian female, seated, in no evident  distress  Neurologic Exam Mental Status: Awake and fully alert. Fluent speech and language. Oriented to place and time. Recent and remote memory intact. Attention span, concentration and fund of knowledge appropriate. Mood and affect appropriate.  Cranial Nerves: Pupils equal, briskly reactive to light. Extraocular movements full without nystagmus. Visual fields full to confrontation. Hearing intact. Facial sensation intact. Face, tongue, palate moves normally and  symmetrically.  Motor: Normal bulk and tone. Normal strength in all tested extremity muscles  Sensory.: intact to touch , pinprick , position and vibratory sensation.  Coordination: Rapid alternating movements normal in all extremities. Finger-to-nose and heel-to-shin unable to test Gait and Station: Arises from chair without difficulty. Stance is normal. Gait demonstrates normal stride length and balance without use of AD.  Reflexes: 1+ and symmetric. Toes downgoing.         ASSESSMENT: CHARIDY CAPPELLETTI is a 33 y.o. 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 Vascular risk factors include polysubstance abuse (cocaine, opiates, benzos, THC, EtOH, tobacco), and hyperhomocysteinemia.  Headaches greatly improved on topiramate.     PLAN:  CVST :  Vascular headache: Repeat CTV 07/2023 significantly decreased clot in left transverse sinus and left sigmoid sinus and resolution of left IJ occlusive thrombus Continue topiramate 50 mg twice daily for headache prophylaxis Start Nurtec as needed at onset of headache, triptan's contraindicated d/t CVST hx Continue Eliquis 5mg  twice daily, will discuss ongoing duration with Dr. Pearlean Brownie, currently has been on for 9 months and denies any recurrent drug use since 12/2022. Will also discuss need of repeat imaging prior to discontinuing   Hyperhomocysteinemia: Iron  deficiency: Vitamin D deficiency: Homocystine level 5.5 (06/2023) Homocystine level 24.9 (03/2023) Homocystine level 54.8 (12/2022) B12 667, folate >20 (06/2023) Iron 90 (06/2023) --> 19 (12/2022) Vitamin D 18.6 (06/2023) Continue oral iron, B6, B12, folic acid and vitamin D Plan repeat labs at follow-up visit   Polysubstance abuse: Tobacco abuse EtOH abuse Denies any recurrent drug use since 12/2022, only occasional EtOH use UDS 12/2022 positive for opioids, cocaine, benzos and THC Discussed importance of complete tobacco cessation and to follow-up with PCP if assistance is needed She questions restarting naltrexone to further assist with alcohol cessation, she was advised to follow-up with her PCP for further discussion     Follow up in 6 months or call earlier if needed    CC:  PCP: Gabriel Earing, FNP    I spent 25 minutes of face-to-face and non-face-to-face time with patient.  This included previsit chart review, lab review, study review, order entry, electronic health record documentation, patient education and discussion regarding above diagnoses and treatment plan and answered all other questions to patient's satisfaction  Ihor Austin, [facility]  [facility] [address] [place], [place] [address]  Phone [phone] Fax [phone] Note: This document was prepared with digital dictation and possible smart phrase technology. Any transcriptional errors that result from this process are unintentional.

## 2023-10-06 ENCOUNTER — Encounter: Payer: Self-pay | Admitting: Adult Health

## 2023-10-06 ENCOUNTER — Telehealth: Payer: Self-pay | Admitting: *Deleted

## 2023-10-06 ENCOUNTER — Ambulatory Visit: Payer: Medicaid Other | Admitting: Adult Health

## 2023-10-06 VITALS — BP 103/72 | HR 70 | Ht 63.0 in | Wt 137.0 lb

## 2023-10-06 DIAGNOSIS — G441 Vascular headache, not elsewhere classified: Secondary | ICD-10-CM | POA: Diagnosis not present

## 2023-10-06 DIAGNOSIS — E559 Vitamin D deficiency, unspecified: Secondary | ICD-10-CM | POA: Diagnosis not present

## 2023-10-06 DIAGNOSIS — E538 Deficiency of other specified B group vitamins: Secondary | ICD-10-CM | POA: Diagnosis not present

## 2023-10-06 DIAGNOSIS — G43009 Migraine without aura, not intractable, without status migrainosus: Secondary | ICD-10-CM

## 2023-10-06 DIAGNOSIS — G08 Intracranial and intraspinal phlebitis and thrombophlebitis: Secondary | ICD-10-CM

## 2023-10-06 DIAGNOSIS — F1010 Alcohol abuse, uncomplicated: Secondary | ICD-10-CM

## 2023-10-06 DIAGNOSIS — E7211 Homocystinuria: Secondary | ICD-10-CM | POA: Diagnosis not present

## 2023-10-06 DIAGNOSIS — F1911 Other psychoactive substance abuse, in remission: Secondary | ICD-10-CM

## 2023-10-06 MED ORDER — NURTEC 75 MG PO TBDP: 75.0000 mg | ORAL_TABLET | ORAL | 11 refills | Status: AC | PRN

## 2023-10-06 MED ORDER — TOPIRAMATE 50 MG PO TABS: 50.0000 mg | ORAL_TABLET | Freq: Two times a day (BID) | ORAL | 5 refills | Status: AC

## 2023-10-06 NOTE — Progress Notes (Signed)
 I agree with the above plan

## 2023-10-06 NOTE — Patient Instructions (Addendum)
 Continue Eliquis for now, will discuss ongoing duration and need of repeat imaging with Dr. Pearlean Brownie - I will keep you updated via MyChart   Continue Topamax 50 mg twice daily for headache prevention  Start Nurtec as needed for onset of headache, do not take more than 1 tablet in 24 hours. Please let me know if no benefit to discuss other options   Signs of a Stroke? Follow the BEFAST method:  Balance Watch for a sudden loss of balance, trouble with coordination or vertigo Eyes Is there a sudden loss of vision in one or both eyes? Or double vision?  Face: Ask the person to smile. Does one side of the face droop or is it numb?  Arms: Ask the person to raise both arms. Does one arm drift downward? Is there weakness or numbness of a leg? Speech: Ask the person to repeat a simple phrase. Does the speech sound slurred/strange? Is the person confused ? Time: If you observe any of these signs, call 911.      Followup in the future with me in 6 months or call earlier if needed       Thank you for coming to see [place] at [facility]. I hope we have been able to provide you high quality care today.  You may receive a patient satisfaction survey over the next few weeks. We would appreciate your feedback and comments so that we may continue to improve ourselves and the health of our patients.

## 2023-10-06 NOTE — Telephone Encounter (Signed)
 She questions restarting naltrexone to further assist with alcohol cessation, she was advised to follow-up with her PCP for further discussion   The above was from Neurology- Per Elmarie Shiley we can do a referral to Behavioral Health if pt would be willing but [facility] won't prescribe the Naltrexone.   TTC pt but no answer and VM not set up.

## 2023-10-07 NOTE — Progress Notes (Signed)
 It was felt CVST was provoked by polysubstance abuse but reports no additional use since 12/2022.  Repeated CTA 07/2023 which showed improvement but still present. Headaches have greatly improved on topiramate. Would you recommend repeat imaging now or at a certain time frame? Hypercoagulable labs were largely unremarkable except elevated homocystine level which has since normalized on supplements. Does she need full hypercoagulable labs?

## 2023-10-08 NOTE — Telephone Encounter (Signed)
 I spoke to pt and she is ok with referral to [facility] and aware [facility] won't rx the naltrexone. She doesn't really have a preference on location, [place], [place] or [place] is fine.

## 2023-10-09 DIAGNOSIS — F1911 Other psychoactive substance abuse, in remission: Secondary | ICD-10-CM | POA: Insufficient documentation

## 2023-10-09 NOTE — Telephone Encounter (Signed)
 Referral placed.

## 2023-10-10 ENCOUNTER — Other Ambulatory Visit: Payer: Self-pay | Admitting: Adult Health

## 2023-10-10 ENCOUNTER — Encounter: Payer: Self-pay | Admitting: Adult Health

## 2023-10-10 NOTE — Progress Notes (Signed)
 I agree with the above plan

## 2023-10-11 DIAGNOSIS — Z419 Encounter for procedure for purposes other than remedying health state, unspecified: Secondary | ICD-10-CM | POA: Diagnosis not present

## 2023-10-13 ENCOUNTER — Other Ambulatory Visit (HOSPITAL_COMMUNITY): Payer: Self-pay

## 2023-10-13 ENCOUNTER — Telehealth: Payer: Self-pay

## 2023-10-13 NOTE — Telephone Encounter (Signed)
 Pharmacy Patient Advocate Encounter   Received notification from Fax that prior authorization for Nurtec 75MG  dispersible tablets is required/requested.   Insurance verification completed.   The patient is insured through [facility] [place] .   Per test claim: PA required; PA submitted to above mentioned insurance via CoverMyMeds Key/confirmation #/EOC [id number] Status is pending

## 2023-10-13 NOTE — Telephone Encounter (Signed)
 Pharmacy Patient Advocate Encounter  Received notification from [facility] Medicaid that Prior Authorization for Nurtec 75MG  dispersible tablets has been DENIED.  Full denial letter will be uploaded to the media tab. See denial reason below.   PA #/Case ID/Reference #: PA Case ID #: [id number]

## 2023-10-14 NOTE — Telephone Encounter (Signed)
 Monica, can you resubmit with updated dx code? Thank you

## 2023-10-14 NOTE — Telephone Encounter (Signed)
 [name], can you review and see if there is a different dx we can provide?

## 2023-10-14 NOTE — Telephone Encounter (Signed)
 Not sure how that dx code got linked to Nurtec. Updated in chart diagnostic code for Nurtec - migraine without aura and vascular headache. Thank you.

## 2023-10-15 NOTE — Telephone Encounter (Signed)
 Will forward to our Pharmacist for an appeals review.

## 2023-10-15 NOTE — Telephone Encounter (Signed)
   Can not resubmit due to previous denial.

## 2023-10-15 NOTE — Telephone Encounter (Signed)
 May have to appeal the denial including the correct diagnosis codes and information for the patient to get the medication

## 2023-10-16 ENCOUNTER — Telehealth: Payer: Self-pay | Admitting: Pharmacist

## 2023-10-16 NOTE — Telephone Encounter (Signed)
 Appeal has been submitted for Nurtec. Will advise when response is received, please be advised that most companies may take 30 days to make a decision. Appeal letter and supporting documentation have been faxed to [phone] on 10/16/2023 @3 :44 pm.  Thank you, Dene Fines, PharmD Clinical Pharmacist  [place]  Direct Dial: [phone]

## 2023-10-17 NOTE — Telephone Encounter (Signed)
 [employer] has approved the appeal for Nurtec:        Thank you, Dene Fines, PharmD Clinical Pharmacist  [place]  Direct Dial: [phone]

## 2023-10-28 NOTE — Progress Notes (Unsigned)
 GYNECOLOGY  VISIT   HPI: [name] is a 33 y.o. single female G0P0000 with PMH dural venous sinus thrombosis, here for heavy bleeding and pain with cycles onset after appendectomy. Cycles are regular occurring every 25-28 days, with bleeding duration 3 to 4 days. Has severe pain 1st or 2nd day of menses with associated nausea and vomiting. She has tried ibuprofen  for pain without relief. She currently smokes a half pack of cigarettes daily. Patient denies HA, backache, mood changes, dyspareunia, pelvic pressure, urinary frequency/urgency, urinary incontinence, diarrhea or constipation.   GYN hx Contraception: None Desires future pregnancy: yes, prefers not to be on contraception.  Last mammogram: Never done due to age Last pap smear: Unclear, will update today        OB History     Gravida  0   Para  0   Term  0   Preterm  0   AB  0   Living  0      SAB  0   IAB  0   Ectopic  0   Multiple  0   Live Births  0              Patient Active Problem List   Diagnosis Date Noted   Clotting disorder (HCC)    History of substance abuse (HCC) 10/09/2023   Dural venous sinus thrombosis 01/06/2023   Hypokalemia 01/06/2023   Substance use disorder 01/06/2023   Menorrhagia 01/06/2023    Past Medical History:  Diagnosis Date   Alcohol abuse    Anxiety    Cerebral venous sinus thrombosis 01/06/2023   Chronic headache    Clotting disorder (HCC)    Cocaine abuse (HCC)    Concussion    Marijuana abuse    Nicotine  dependence    Opioid use disorder    Polysubstance abuse (HCC)    Smoker     Past Surgical History:  Procedure Laterality Date   APPENDECTOMY      Current Outpatient Medications  Medication Sig Dispense Refill   busPIRone  ([name] ) 10 MG tablet Take 1 tablet (10 mg total) by mouth 2 (two) times daily. 60 tablet 5   cyanocobalamin  (VITAMIN B12) 1000 MCG tablet Take 1,000 mcg by mouth daily.     ferrous sulfate  325 (65 FE) MG tablet Take 325 mg  by mouth daily with breakfast.     folic acid  (FOLVITE ) 1 MG tablet Take 1 tablet (1 mg total) by mouth daily. 30 tablet 5   ondansetron  (ZOFRAN -ODT) 4 MG disintegrating tablet Take 1 tablet (4 mg total) by mouth every 8 (eight) hours as needed for nausea. 10 tablet 0   pyridOXINE  (VITAMIN B6) 50 MG tablet Take 50 mg by mouth daily.     Rimegepant Sulfate (NURTEC) 75 MG TBDP Take 1 tablet (75 mg total) by mouth as needed. 16 tablet 11   topiramate  (TOPAMAX ) 50 MG tablet Take 1 tablet (50 mg total) by mouth 2 (two) times daily. 60 tablet 5   Vitamin D , Ergocalciferol , (DRISDOL ) 1.25 MG (50000 UNIT) CAPS capsule Take 1 capsule (50,000 Units total) by mouth every 7 (seven) days. 12 capsule 0   ferrous sulfate  325 (65 FE) MG tablet Take 1 tablet (325 mg total) by mouth daily with breakfast. 100 tablet 0   No current facility-administered medications for this visit.     ALLERGIES: Gentamicin and Penicillins  Family History  Problem Relation Age of Onset   Cancer Mother  Skin cancer on nose   Anxiety disorder Mother    Diabetes Mother    Hypertension Mother    Alcohol abuse Father    ADD / ADHD Brother    Heart attack Paternal Grandmother     Social History   Socioeconomic History   Marital status: Single    Spouse name: Not on file   Number of children: 0   Years of education: Not on file   Highest education level: GED or equivalent  Occupational History   Not on file  Tobacco Use   Smoking status: Every Day    Current packs/day: 0.50    Average packs/day: 0.5 packs/day for 18.3 years (9.2 ttl pk-yrs)    Types: Cigarettes    Start date: 2007   Smokeless tobacco: Never  Vaping Use   Vaping status: Never Used  Substance and Sexual Activity   Alcohol use: Not Currently    Comment: occ   Drug use: Yes    Types: Cocaine, Benzodiazepines, Marijuana    Comment: last used 06/02/18   Sexual activity: Yes    Birth control/protection: None  Other Topics Concern   Not on  file  Social History Narrative   Not on file   Social Drivers of Health   Financial Resource Strain: Not on file  Food Insecurity: No Food Insecurity (01/06/2023)   Hunger Vital Sign    Worried About Running Out of Food in the Last Year: Never true    Ran Out of Food in the Last Year: Never true  Transportation Needs: No Transportation Needs (01/06/2023)   PRAPARE - Administrator, Civil Service (Medical): No    Lack of Transportation (Non-Medical): No  Physical Activity: Not on file  Stress: Not on file  Social Connections: Not on file  Intimate Partner Violence: Not At Risk (01/06/2023)   Humiliation, Afraid, Rape, and Kick questionnaire    Fear of Current or Ex-Partner: No    Emotionally Abused: No    Physically Abused: No    Sexually Abused: No    Review of Systems  PHYSICAL EXAMINATION:    BP 117/77   Pulse 97   Ht 5\' 3"  (1.6 m)   Wt 134 lb 14.4 oz (61.2 kg)   LMP 10/27/2023 (Exact Date)   BMI 23.90 kg/m     General appearance: alert, cooperative and appears stated age Head: Normocephalic, without obvious abnormality, atraumatic Lungs: clear to auscultation bilaterally Breasts: Deferred Heart: regular rate and rhythm Extremities: extremities normal, atraumatic, no cyanosis or edema Skin: Skin color, texture, turgor normal. No rashes or lesions Lymph nodes: No abnormal inguinal nodes palpated Neurologic: Grossly normal  Pelvic: External genitalia:  no lesions              Urethra:  normal appearing urethra with no masses, tenderness or lesions              Bartholins and Skenes: normal                 Vagina: normal appearing vagina with normal color and discharge, no lesions              Cervix: no lesions                Bimanual Exam:  Uterus:  normal size, contour, position, consistency, mobility, non-tender              Adnexa: no mass, fullness, tenderness  Chaperone was present for exam  ASSESSMENT &  PLAN  1. Abnormal uterine bleeding  (Primary) Patient with PMH dural venous sinus thrombosis presenting for regular heavy menses. No abnormalities noted on today's exam. Labs, pap, and TVUS ordered today to r/o structural and other common etiologies of AUB. Will trial course of NSAIDs to reduce bleeding volume in interim. Patient prefers not to use hormonal therapy, with desire to conceive in relative near future, and history of clotting. Will direct appropriately pending the following tests: - POCT urine pregnancy - TSH Rfx on Abnormal to Free T4 - CBC - Cervicovaginal ancillary only - Cytology - PAP - US  PELVIC COMPLETE WITH TRANSVAGINAL; Future   An After Visit Summary was printed and given to the patient.   Sakeena Teall E Fines Kimberlin, PA-C 4/30/20259:17 PM

## 2023-10-29 ENCOUNTER — Encounter: Payer: Self-pay | Admitting: Physician Assistant

## 2023-10-29 ENCOUNTER — Ambulatory Visit (INDEPENDENT_AMBULATORY_CARE_PROVIDER_SITE_OTHER): Admitting: Physician Assistant

## 2023-10-29 ENCOUNTER — Other Ambulatory Visit (HOSPITAL_COMMUNITY)
Admission: RE | Admit: 2023-10-29 | Discharge: 2023-10-29 | Disposition: A | Discharge status: Home / Self Care | Source: Ambulatory Visit | Attending: Physician Assistant | Admitting: Physician Assistant

## 2023-10-29 VITALS — BP 117/77 | HR 97 | Ht 63.0 in | Wt 134.9 lb

## 2023-10-29 DIAGNOSIS — N939 Abnormal uterine and vaginal bleeding, unspecified: Secondary | ICD-10-CM | POA: Diagnosis not present

## 2023-10-29 DIAGNOSIS — D689 Coagulation defect, unspecified: Secondary | ICD-10-CM | POA: Insufficient documentation

## 2023-10-29 DIAGNOSIS — Z3202 Encounter for pregnancy test, result negative: Secondary | ICD-10-CM | POA: Diagnosis not present

## 2023-10-29 LAB — POCT URINE PREGNANCY: Preg Test, Ur: NEGATIVE

## 2023-10-29 MED ORDER — IBUPROFEN 600 MG PO TABS: 600.0000 mg | ORAL_TABLET | Freq: Four times a day (QID) | ORAL | 1 refills | Status: AC | PRN

## 2023-10-29 NOTE — Patient Instructions (Addendum)
 Take 600 mg ibuprofen  2-4 times daily starting at the onset of your period until bleeding stops.   Once you have gotten your ultrasound, we will set up an appointment to check in and/or change your regimen if its not working.

## 2023-10-29 NOTE — Progress Notes (Signed)
 Painful cramps w/ cycles; vomiting; blood clot in brain since July 2024 so unable to take birth control.

## 2023-10-29 NOTE — Addendum Note (Signed)
 Addended by: Tansy Lorek on: 10/29/2023 09:30 PM   Modules accepted: Orders

## 2023-10-30 ENCOUNTER — Ambulatory Visit (HOSPITAL_COMMUNITY)
Admission: RE | Admit: 2023-10-30 | Discharge: 2023-10-30 | Disposition: A | Discharge status: Home / Self Care | Source: Ambulatory Visit | Attending: Physician Assistant | Admitting: Physician Assistant

## 2023-10-30 DIAGNOSIS — N939 Abnormal uterine and vaginal bleeding, unspecified: Secondary | ICD-10-CM | POA: Diagnosis not present

## 2023-10-30 LAB — CBC
Hematocrit: 43.3 % (ref 34.0–46.6)
Hemoglobin: 14.3 g/dL (ref 11.1–15.9)
MCH: 31.9 pg (ref 26.6–33.0)
MCHC: 33 g/dL (ref 31.5–35.7)
MCV: 97 fL (ref 79–97)
Platelets: 318 10*3/uL (ref 150–450)
RBC: 4.48 x10E6/uL (ref 3.77–5.28)
RDW: 12.6 % (ref 11.7–15.4)
WBC: 9.7 10*3/uL (ref 3.4–10.8)

## 2023-10-30 LAB — TSH RFX ON ABNORMAL TO FREE T4: TSH: 1.13 u[IU]/mL (ref 0.450–4.500)

## 2023-10-30 LAB — CERVICOVAGINAL ANCILLARY ONLY
Chlamydia: NEGATIVE
Comment: NEGATIVE
Comment: NEGATIVE
Comment: NORMAL
Neisseria Gonorrhea: NEGATIVE
Trichomonas: NEGATIVE

## 2023-10-31 LAB — CYTOLOGY - PAP
Comment: NEGATIVE
Diagnosis: NEGATIVE
High risk HPV: NEGATIVE

## 2023-11-02 ENCOUNTER — Encounter: Payer: Self-pay | Admitting: Physician Assistant

## 2023-11-10 DIAGNOSIS — Z419 Encounter for procedure for purposes other than remedying health state, unspecified: Secondary | ICD-10-CM | POA: Diagnosis not present

## 2023-11-15 ENCOUNTER — Ambulatory Visit: Payer: Self-pay | Admitting: Physician Assistant

## 2023-11-15 ENCOUNTER — Encounter: Payer: Self-pay | Admitting: Adult Health

## 2023-11-17 ENCOUNTER — Other Ambulatory Visit: Payer: Self-pay | Admitting: Physician Assistant

## 2023-11-17 DIAGNOSIS — R188 Other ascites: Secondary | ICD-10-CM

## 2023-11-17 NOTE — Progress Notes (Signed)
 Called patient to discuss 11/10/23 pelvic ultrasound findings: unchanged, benign-appearing R ovarian mass, bilateral hydrosalpinx, moderate pelvic free fluid of unclear etiology, greater than seen on 06/27/2018 US .  She vocalized agreement with continued monitoring of free pelvic fluid by US , next in 3 months. Will order today.    She does desire future fertility though not actively trying to conceive.We discussed hydrosalpinges, increased risk of ectopic, possible subfertility.  Discussed possibility of future salpingectomy with IVF; I advised she present for preconception visit to formulate plan with a provider when ready.   Advised IUD to treat AUB/dysmenorrhea given her PMH dural venous sinus thrombosis. Discussed misoprostol pre-treatment since G0. Patient will take time to consider this option and contact us  if desired.   Neurology has discontinued use of Eliquis  10/10/23 and patient may continue to take ibuprofen  for heavy vaginal bleeding in interim.   All of the patient's questions were answered.   [place], PA-C 11/17/23

## 2023-12-01 DIAGNOSIS — F1211 Cannabis abuse, in remission: Secondary | ICD-10-CM | POA: Diagnosis not present

## 2023-12-01 DIAGNOSIS — F1411 Cocaine abuse, in remission: Secondary | ICD-10-CM | POA: Diagnosis not present

## 2023-12-03 ENCOUNTER — Ambulatory Visit: Admitting: Obstetrics and Gynecology

## 2023-12-03 ENCOUNTER — Encounter: Payer: Self-pay | Admitting: Obstetrics and Gynecology

## 2023-12-03 VITALS — BP 106/67 | HR 93 | Ht 63.0 in | Wt 135.0 lb

## 2023-12-03 DIAGNOSIS — Z30430 Encounter for insertion of intrauterine contraceptive device: Secondary | ICD-10-CM

## 2023-12-03 DIAGNOSIS — Z3202 Encounter for pregnancy test, result negative: Secondary | ICD-10-CM

## 2023-12-03 LAB — POCT URINE PREGNANCY: Preg Test, Ur: NEGATIVE

## 2023-12-03 MED ORDER — LEVONORGESTREL 20 MCG/DAY IU IUD
1.0000 | INTRAUTERINE_SYSTEM | Freq: Once | INTRAUTERINE | Status: AC
  Administered 2023-12-03: 1 via INTRAUTERINE

## 2023-12-03 NOTE — Progress Notes (Signed)
 33 yo P0 here for Mirena IUD insertion for management of AUB. Patient is without any new complaints  Past Medical History:  Diagnosis Date   Alcohol abuse    Anxiety    Cerebral venous sinus thrombosis 01/06/2023   Chronic headache    Clotting disorder (HCC)    Cocaine abuse (HCC)    Concussion    Marijuana abuse    Nicotine  dependence    Opioid use disorder    Polysubstance abuse (HCC)    Smoker    Past Surgical History:  Procedure Laterality Date   APPENDECTOMY     Family History  Problem Relation Age of Onset   Cancer Mother        Skin cancer on nose   Anxiety disorder Mother    Diabetes Mother    Hypertension Mother    Alcohol abuse Father    ADD / ADHD Brother    Heart attack Paternal Grandmother    Social History   Tobacco Use   Smoking status: Every Day    Current packs/day: 0.50    Average packs/day: 0.5 packs/day for 18.4 years (9.2 ttl pk-yrs)    Types: Cigarettes    Start date: 2007   Smokeless tobacco: Never  Vaping Use   Vaping status: Never Used  Substance Use Topics   Alcohol use: Not Currently    Comment: occ   Drug use: Yes    Types: Cocaine, Benzodiazepines, Marijuana    Comment: last used 06/02/18   ROS See pertinent in HPI. All other systems reviewed and non contributory Blood pressure 106/67, pulse 93, height 5\' 3"  (1.6 m), weight 135 lb (61.2 kg), last menstrual period 11/25/2023. GENERAL: Well-developed, well-nourished female in no acute distress.  ABDOMEN: Soft, nontender, nondistended. No organomegaly. PELVIC: Normal external female genitalia. Vagina is pink and rugated.  Normal discharge. Normal appearing cervix. Uterus is normal in size. No adnexal mass or tenderness. Chaperone present during the pelvic exam EXTREMITIES: No cyanosis, clubbing, or edema, 2+ distal pulses.   A/P 33 yo P0 here fotr IUD insertion IUD Procedure Note Patient identified, informed consent performed, signed copy in chart, time out was performed.  Urine  pregnancy test negative.  Speculum placed in the vagina.  Cervix visualized.  Cleaned with Betadine x 2.  Grasped anteriorly with a single tooth tenaculum.  Uterus sounded to 6 cm.  Mirena IUD placed per manufacturer's recommendations.  Strings trimmed to 3 cm. Tenaculum was removed, good hemostasis noted.  Patient tolerated procedure well.   Patient given post procedure instructions and Mirena care card with expiration date.  Patient is asked to check IUD strings periodically and follow up in 4-6 weeks for IUD check.  Discussed ultrasound findings of bilateral hydrosalpinx and its impact on future fertility. Patient verbalized understanding. Patient aware that she may need an early referral to infertility specialist when ready for conception

## 2023-12-03 NOTE — Progress Notes (Addendum)
 33 y.o. GYN presents for Mirena IUD Insertion.  Last unprotected sex Sept 2024.  UPT Negative.  Administrations This Visit     levonorgestrel (MIRENA) 20 MCG/DAY IUD 1 each     Admin Date 12/03/2023 Action Given Dose 1 each Route Intrauterine Documented By Mancel Seashore, RMA

## 2023-12-11 DIAGNOSIS — Z419 Encounter for procedure for purposes other than remedying health state, unspecified: Secondary | ICD-10-CM | POA: Diagnosis not present

## 2023-12-12 DIAGNOSIS — F419 Anxiety disorder, unspecified: Secondary | ICD-10-CM | POA: Diagnosis not present

## 2024-01-12 ENCOUNTER — Ambulatory Visit: Admitting: Obstetrics and Gynecology

## 2024-01-13 ENCOUNTER — Other Ambulatory Visit: Payer: Self-pay | Admitting: Medical Genetics

## 2024-01-21 ENCOUNTER — Ambulatory Visit (HOSPITAL_BASED_OUTPATIENT_CLINIC_OR_DEPARTMENT_OTHER)
Admission: RE | Admit: 2024-01-21 | Discharge: 2024-01-21 | Disposition: A | Discharge status: Home / Self Care | Source: Ambulatory Visit | Attending: Physician Assistant | Admitting: Physician Assistant

## 2024-01-21 ENCOUNTER — Ambulatory Visit: Admitting: Family Medicine

## 2024-01-21 ENCOUNTER — Encounter: Payer: Self-pay | Admitting: Family Medicine

## 2024-01-21 VITALS — BP 103/68 | HR 75 | Ht 63.0 in | Wt 145.0 lb

## 2024-01-21 DIAGNOSIS — Z975 Presence of (intrauterine) contraceptive device: Secondary | ICD-10-CM | POA: Diagnosis not present

## 2024-01-21 DIAGNOSIS — R188 Other ascites: Secondary | ICD-10-CM | POA: Insufficient documentation

## 2024-01-21 NOTE — Progress Notes (Unsigned)
   [facility]  IUD STRING CHECK PROGRESS NOTE  History:  33 y.o. G0P0000 here today for today for IUD string check; {IUD:23561} was placed  ***. No complaints about the {IUD:23561}, no concerning side effects.  The following portions of the patient's history were reviewed and updated as appropriate: allergies, current medications, past family history, past medical history, past social history, past surgical history and problem list. Last pap smear on *** was normal, *** HRHPV.  Review of Systems:  Pertinent items are noted in HPI.   Objective:  Physical Exam There were no vitals taken for this visit. Gen: NAD Abd: Soft, nontender and nondistended Pelvic: Normal appearing external genitalia; normal appearing vaginal mucosa and cervix.  IUD strings visualized, about *** cm in length outside cervix.   Assessment & Plan:  Normal IUD check. Patient to keep IUD in place for five years; can come in for removal if she desires pregnancy within the next five years. Routine preventative health maintenance measures emphasized  Alain Sor, MD

## 2024-01-21 NOTE — Progress Notes (Unsigned)
 Pt presents for IUD string check. Pt has no questions or concerns at this time.

## 2024-01-22 ENCOUNTER — Encounter: Payer: Self-pay | Admitting: Family Medicine

## 2024-01-23 ENCOUNTER — Other Ambulatory Visit (HOSPITAL_COMMUNITY)
Admission: RE | Admit: 2024-01-23 | Discharge: 2024-01-23 | Disposition: A | Discharge status: Home / Self Care | Payer: Self-pay | Source: Ambulatory Visit | Attending: Oncology | Admitting: Oncology

## 2024-01-27 ENCOUNTER — Other Ambulatory Visit: Payer: Self-pay

## 2024-01-27 ENCOUNTER — Emergency Department (HOSPITAL_COMMUNITY)
Admission: EM | Admit: 2024-01-27 | Discharge: 2024-01-27 | Disposition: A | Discharge status: Home / Self Care | Attending: Emergency Medicine | Admitting: Emergency Medicine

## 2024-01-27 ENCOUNTER — Emergency Department (HOSPITAL_COMMUNITY)

## 2024-01-27 ENCOUNTER — Encounter (HOSPITAL_COMMUNITY): Payer: Self-pay

## 2024-01-27 DIAGNOSIS — R519 Headache, unspecified: Secondary | ICD-10-CM | POA: Diagnosis not present

## 2024-01-27 DIAGNOSIS — H9201 Otalgia, right ear: Secondary | ICD-10-CM | POA: Diagnosis present

## 2024-01-27 DIAGNOSIS — R509 Fever, unspecified: Secondary | ICD-10-CM | POA: Insufficient documentation

## 2024-01-27 LAB — CBC WITH DIFFERENTIAL/PLATELET
Abs Immature Granulocytes: 0.02 K/uL (ref 0.00–0.07)
Basophils Absolute: 0 K/uL (ref 0.0–0.1)
Basophils Relative: 1 %
Eosinophils Absolute: 0.2 K/uL (ref 0.0–0.5)
Eosinophils Relative: 3 %
HCT: 39.2 % (ref 36.0–46.0)
Hemoglobin: 13.4 g/dL (ref 12.0–15.0)
Immature Granulocytes: 0 %
Lymphocytes Relative: 20 %
Lymphs Abs: 1.2 K/uL (ref 0.7–4.0)
MCH: 32.7 pg (ref 26.0–34.0)
MCHC: 34.2 g/dL (ref 30.0–36.0)
MCV: 95.6 fL (ref 80.0–100.0)
Monocytes Absolute: 0.7 K/uL (ref 0.1–1.0)
Monocytes Relative: 12 %
Neutro Abs: 3.9 K/uL (ref 1.7–7.7)
Neutrophils Relative %: 64 %
Platelets: 268 K/uL (ref 150–400)
RBC: 4.1 MIL/uL (ref 3.87–5.11)
RDW: 14.1 % (ref 11.5–15.5)
WBC: 6 K/uL (ref 4.0–10.5)
nRBC: 0 % (ref 0.0–0.2)

## 2024-01-27 LAB — BASIC METABOLIC PANEL WITH GFR
Anion gap: 10 (ref 5–15)
BUN: 7 mg/dL (ref 6–20)
CO2: 22 mmol/L (ref 22–32)
Calcium: 8.7 mg/dL — ABNORMAL LOW (ref 8.9–10.3)
Chloride: 102 mmol/L (ref 98–111)
Creatinine, Ser: 0.61 mg/dL (ref 0.44–1.00)
GFR, Estimated: >60 mL/min (ref >60)
Glucose, Bld: 94 mg/dL (ref 70–99)
Potassium: 3.6 mmol/L (ref 3.5–5.1)
Sodium: 134 mmol/L — ABNORMAL LOW (ref 135–145)

## 2024-01-27 LAB — HCG, SERUM, QUALITATIVE: Preg, Serum: NEGATIVE

## 2024-01-27 MED ORDER — CLINDAMYCIN HCL 150 MG PO CAPS: 150.0000 mg | ORAL_CAPSULE | Freq: Four times a day (QID) | ORAL | 0 refills | Status: DC

## 2024-01-27 MED ORDER — IOHEXOL 300 MG/ML  SOLN
75.0000 mL | Freq: Once | INTRAMUSCULAR | Status: AC | PRN
  Administered 2024-01-27: 75 mL via INTRAVENOUS

## 2024-01-27 NOTE — Discharge Instructions (Addendum)
 You were seen in the emergency department for a pain behind your right ear.  Your ear did not show any sign of infection and you had a CAT scan that showed continued clot in the ventricle of your brain but unchanged from your last CT in January.  We are putting you on antibiotics for possible dental infection.  You can do Tylenol  and ibuprofen  for pain.  Return if any worsening or concerning symptoms

## 2024-01-27 NOTE — ED Triage Notes (Signed)
 Pt arrived via POV c/o right posterior ear pain. Pt also reports recent cough and dental pain.

## 2024-01-27 NOTE — ED Provider Notes (Signed)
 [place] EMERGENCY DEPARTMENT AT [facility] Provider Note   CSN: [id number] Arrival date & time: 01/27/24  1639     Patient presents with: Otalgia   [name] is a 33 y.o. female.  She is here with a complaint of pain behind her right ear, mild headache, subjective fevers and chills that started last night.  No trauma.  She is concerned because she said she had a venous thrombosis last year.  No numbness or weakness no blurry vision double vision.  She did have some dental pain yesterday but that seems better today.  {Add pertinent medical, surgical, social history, OB history to YEP:67052} The history is provided by the patient.  Otalgia Location:  Right Behind ear:  No abnormality Quality:  Throbbing Severity:  Moderate Onset quality:  Gradual Duration:  2 days Timing:  Constant Progression:  Unchanged Chronicity:  New Relieved by:  None tried Worsened by:  Nothing Ineffective treatments:  None tried Associated symptoms: fever and headaches   Associated symptoms: no abdominal pain, no ear discharge, no hearing loss, no neck pain, no rhinorrhea, no sore throat, no tinnitus and no vomiting        Prior to Admission medications   Medication Sig Start Date End Date Taking? Authorizing Provider  busPIRone  ([name] ) 10 MG tablet Take 1 tablet (10 mg total) by mouth 2 (two) times daily. 06/23/23   Whitfield Raisin, NP  cyanocobalamin  (VITAMIN B12) 1000 MCG tablet Take 1,000 mcg by mouth daily.    [provider]  ferrous sulfate  325 (65 FE) MG tablet Take 1 tablet (325 mg total) by mouth daily with breakfast. Patient not taking: Reported on 01/21/2024 01/08/23 10/06/23  Gregary Sharper, MD  ferrous sulfate  325 (65 FE) MG tablet Take 325 mg by mouth daily with breakfast.    [provider]  folic acid  (FOLVITE ) 1 MG tablet Take 1 tablet (1 mg total) by mouth daily. 06/23/23   Whitfield Raisin, NP  ibuprofen  (ADVIL ) 600 MG tablet Take 1 tablet (600 mg  total) by mouth every 6 (six) hours as needed. Take 600 mg 2-3 times daily for reduction of bleeding. Use at the start of period for 3-5 days or until bleeding stops. 10/29/23   Davis, Devon E, PA-C  ondansetron  (ZOFRAN -ODT) 4 MG disintegrating tablet Take 1 tablet (4 mg total) by mouth every 8 (eight) hours as needed for nausea. 09/08/23   Joesph Annabella HERO, FNP  pyridOXINE  (VITAMIN B6) 50 MG tablet Take 50 mg by mouth daily.    [provider]  Rimegepant Sulfate (NURTEC) 75 MG TBDP Take 1 tablet (75 mg total) by mouth as needed. 10/06/23   Whitfield Raisin, NP  topiramate  (TOPAMAX ) 50 MG tablet Take 1 tablet (50 mg total) by mouth 2 (two) times daily. 10/06/23   Whitfield Raisin, NP  Vitamin D , Ergocalciferol , (DRISDOL ) 1.25 MG (50000 UNIT) CAPS capsule Take 1 capsule (50,000 Units total) by mouth every 7 (seven) days. 09/08/23   Joesph Annabella HERO, FNP    Allergies: Gentamicin and Penicillins    Review of Systems  Constitutional:  Positive for fever.  HENT:  Positive for ear pain. Negative for ear discharge, hearing loss, rhinorrhea, sore throat and tinnitus.   Gastrointestinal:  Negative for abdominal pain and vomiting.  Musculoskeletal:  Negative for neck pain.  Neurological:  Positive for headaches.    Updated Vital Signs BP 123/84 (BP Location: Right Arm)   Pulse 96   Temp 98.9 F (37.2 C) (Oral)  Resp 16   Ht 5' 3 (1.6 m)   Wt 65.7 kg   LMP 01/13/2024 (Approximate)   SpO2 96%   BMI 25.66 kg/m   Physical Exam Vitals and nursing note reviewed.  Constitutional:      General: She is not in acute distress.    Appearance: Normal appearance. She is well-developed.  HENT:     Head: Normocephalic and atraumatic.     Right Ear: Tympanic membrane, ear canal and external ear normal.     Left Ear: Tympanic membrane, ear canal and external ear normal.     Nose: Nose normal.     Mouth/Throat:     Mouth: Mucous membranes are moist.     Pharynx: Oropharynx is clear.  Eyes:      Conjunctiva/sclera: Conjunctivae normal.  Cardiovascular:     Rate and Rhythm: Normal rate and regular rhythm.     Heart sounds: No murmur heard. Pulmonary:     Effort: Pulmonary effort is normal. No respiratory distress.     Breath sounds: Normal breath sounds.  Abdominal:     Palpations: Abdomen is soft.     Tenderness: There is no abdominal tenderness. There is no guarding or rebound.  Musculoskeletal:        General: No deformity.     Cervical back: Neck supple.  Lymphadenopathy:     Cervical: No cervical adenopathy.  Skin:    General: Skin is warm and dry.  Neurological:     General: No focal deficit present.     Mental Status: She is alert.     GCS: GCS eye subscore is 4. GCS verbal subscore is 5. GCS motor subscore is 6.     (all labs ordered are listed, but only abnormal results are displayed) Labs Reviewed - No data to display  EKG: None  Radiology: No results found.  {Document cardiac monitor, telemetry assessment procedure when appropriate:32947} Procedures   Medications Ordered in the ED - No data to display    {Click here for ABCD2, HEART and other calculators REFRESH Note before signing:1}                              Medical Decision Making Amount and/or Complexity of Data Reviewed Labs: ordered. Radiology: ordered.   This patient complains of ***; this involves an extensive number of treatment Options and is a complaint that carries with it a high risk of complications and morbidity. The differential includes ***  I ordered, reviewed and interpreted labs, which included *** I ordered medication *** and reviewed PMP when indicated. I ordered imaging studies which included *** and I independently    visualized and interpreted imaging which showed *** Additional history obtained from *** Previous records obtained and reviewed *** I consulted *** and discussed lab and imaging findings and discussed disposition.  Cardiac monitoring reviewed,  *** Social determinants considered, *** Critical Interventions: ***  After the interventions stated above, I reevaluated the patient and found *** Admission and further testing considered, ***   {Document critical care time when appropriate  Document review of labs and clinical decision tools ie CHADS2VASC2, etc  Document your independent review of radiology images and any outside records  Document your discussion with family members, caretakers and with consultants  Document social determinants of health affecting pt's care  Document your decision making why or why not admission, treatments were needed:32947:::1}   Final diagnoses:  None  ED Discharge Orders     None

## 2024-02-01 ENCOUNTER — Ambulatory Visit: Payer: Self-pay | Admitting: Physician Assistant

## 2024-02-06 LAB — GENECONNECT MOLECULAR SCREEN: Genetic Analysis Overall Interpretation: NEGATIVE

## 2024-03-09 ENCOUNTER — Encounter: Payer: Self-pay | Admitting: Physician Assistant

## 2024-03-26 ENCOUNTER — Other Ambulatory Visit: Payer: Self-pay

## 2024-03-26 ENCOUNTER — Encounter (HOSPITAL_COMMUNITY): Payer: Self-pay

## 2024-03-26 ENCOUNTER — Emergency Department (HOSPITAL_COMMUNITY): Payer: MEDICAID

## 2024-03-26 ENCOUNTER — Emergency Department (HOSPITAL_COMMUNITY)
Admission: EM | Admit: 2024-03-26 | Discharge: 2024-03-26 | Disposition: A | Discharge status: Home / Self Care | Payer: MEDICAID | Attending: Emergency Medicine | Admitting: Emergency Medicine

## 2024-03-26 DIAGNOSIS — R059 Cough, unspecified: Secondary | ICD-10-CM | POA: Diagnosis present

## 2024-03-26 DIAGNOSIS — Z720 Tobacco use: Secondary | ICD-10-CM | POA: Insufficient documentation

## 2024-03-26 DIAGNOSIS — B349 Viral infection, unspecified: Secondary | ICD-10-CM | POA: Diagnosis not present

## 2024-03-26 DIAGNOSIS — R519 Headache, unspecified: Secondary | ICD-10-CM | POA: Diagnosis not present

## 2024-03-26 LAB — CBC
HCT: 39.1 % (ref 36.0–46.0)
Hemoglobin: 13.4 g/dL (ref 12.0–15.0)
MCH: 33 pg (ref 26.0–34.0)
MCHC: 34.3 g/dL (ref 30.0–36.0)
MCV: 96.3 fL (ref 80.0–100.0)
Platelets: 325 K/uL (ref 150–400)
RBC: 4.06 MIL/uL (ref 3.87–5.11)
RDW: 14.3 % (ref 11.5–15.5)
WBC: 9.5 K/uL (ref 4.0–10.5)
nRBC: 0 % (ref 0.0–0.2)

## 2024-03-26 LAB — BASIC METABOLIC PANEL WITH GFR
Anion gap: 11 (ref 5–15)
BUN: 8 mg/dL (ref 6–20)
CO2: 23 mmol/L (ref 22–32)
Calcium: 8.2 mg/dL — ABNORMAL LOW (ref 8.9–10.3)
Chloride: 103 mmol/L (ref 98–111)
Creatinine, Ser: 0.44 mg/dL (ref 0.44–1.00)
GFR, Estimated: >60 mL/min (ref >60)
Glucose, Bld: 78 mg/dL (ref 70–99)
Potassium: 3.6 mmol/L (ref 3.5–5.1)
Sodium: 137 mmol/L (ref 135–145)

## 2024-03-26 LAB — I-STAT CHEM 8, ED
BUN: 6 mg/dL (ref 6–20)
Calcium, Ion: 1.11 mmol/L — ABNORMAL LOW (ref 1.15–1.40)
Chloride: 103 mmol/L (ref 98–111)
Creatinine, Ser: 0.5 mg/dL (ref 0.44–1.00)
Glucose, Bld: 78 mg/dL (ref 70–99)
HCT: 42 % (ref 36.0–46.0)
Hemoglobin: 14.3 g/dL (ref 12.0–15.0)
Potassium: 3.7 mmol/L (ref 3.5–5.1)
Sodium: 139 mmol/L (ref 135–145)
TCO2: 22 mmol/L (ref 22–32)

## 2024-03-26 LAB — RESP PANEL BY RT-PCR (RSV, FLU A&B, COVID)  RVPGX2
Influenza A by PCR: NEGATIVE
Influenza B by PCR: NEGATIVE
Resp Syncytial Virus by PCR: NEGATIVE
SARS Coronavirus 2 by RT PCR: NEGATIVE

## 2024-03-26 LAB — HCG, SERUM, QUALITATIVE: Preg, Serum: NEGATIVE

## 2024-03-26 MED ORDER — PSEUDOEPHEDRINE HCL 60 MG PO TABS
60.0000 mg | ORAL_TABLET | Freq: Once | ORAL | Status: AC
  Administered 2024-03-26: 60 mg via ORAL
  Filled 2024-03-26: qty 1

## 2024-03-26 MED ORDER — IOHEXOL 350 MG/ML SOLN
75.0000 mL | Freq: Once | INTRAVENOUS | Status: AC | PRN
  Administered 2024-03-26: 75 mL via INTRAVENOUS

## 2024-03-26 MED ORDER — ACETAMINOPHEN 500 MG PO TABS
1000.0000 mg | ORAL_TABLET | Freq: Once | ORAL | Status: AC
  Administered 2024-03-26: 1000 mg via ORAL
  Filled 2024-03-26: qty 2

## 2024-03-26 NOTE — ED Triage Notes (Signed)
 Pt complaining of coughing, congestion, head pressure and fatigue for the last 2 days

## 2024-03-26 NOTE — ED Notes (Signed)
 Patient transported to CT

## 2024-03-26 NOTE — Discharge Instructions (Addendum)
 Your symptoms today are likely secondary to a viral illness.  Continue to hydrate to avoid dehydration.  Continue Tylenol /ibuprofen  as needed for headache.  Continue Sudafed/Mucinex as needed for congestion.  Return to the emergency department if your symptoms worsen.

## 2024-03-26 NOTE — ED Provider Notes (Signed)
 [place] EMERGENCY DEPARTMENT AT [facility] Provider Note   CSN: [id number] Arrival date & time: 03/26/24  1145     Patient presents with: Flu like Symptoms    [name] is a 33 y.o. female.   33 year old female presenting with URI symptoms.  Patient notes that for approximately 2 days she has had a cough, nasal congestion, as well as headache.  No fever, abdominal pain/vomiting, chest pain/shortness of breath, pain/difficulty swallowing, otalgia.  Patient did take Mucinex last night which helped alleviate her symptoms, she has not used any medications today.  Her friend is sick with similar symptoms.  On Topamax  for headaches.        Prior to Admission medications   Medication Sig Start Date End Date Taking? Authorizing Provider  busPIRone  ([name] ) 10 MG tablet Take 1 tablet (10 mg total) by mouth 2 (two) times daily. 06/23/23   Whitfield Raisin, NP  clindamycin  (CLEOCIN ) 150 MG capsule Take 1 capsule (150 mg total) by mouth every 6 (six) hours. 01/27/24   Towana Ozell BROCKS, MD  cyanocobalamin  (VITAMIN B12) 1000 MCG tablet Take 1,000 mcg by mouth daily.    [provider]  ferrous sulfate  325 (65 FE) MG tablet Take 1 tablet (325 mg total) by mouth daily with breakfast. Patient not taking: Reported on 01/21/2024 01/08/23 10/06/23  Gregary Ozell, MD  ferrous sulfate  325 (65 FE) MG tablet Take 325 mg by mouth daily with breakfast.    [provider]  folic acid  (FOLVITE ) 1 MG tablet Take 1 tablet (1 mg total) by mouth daily. 06/23/23   Whitfield Raisin, NP  ibuprofen  (ADVIL ) 600 MG tablet Take 1 tablet (600 mg total) by mouth every 6 (six) hours as needed. Take 600 mg 2-3 times daily for reduction of bleeding. Use at the start of period for 3-5 days or until bleeding stops. 10/29/23   Davis, Devon E, PA-C  ondansetron  (ZOFRAN -ODT) 4 MG disintegrating tablet Take 1 tablet (4 mg total) by mouth every 8 (eight) hours as needed for nausea. 09/08/23   Joesph Annabella HERO, FNP  pyridOXINE  (VITAMIN B6) 50 MG tablet Take 50 mg by mouth daily.    [provider]  Rimegepant Sulfate (NURTEC) 75 MG TBDP Take 1 tablet (75 mg total) by mouth as needed. 10/06/23   Whitfield Raisin, NP  topiramate  (TOPAMAX ) 50 MG tablet Take 1 tablet (50 mg total) by mouth 2 (two) times daily. 10/06/23   Whitfield Raisin, NP  Vitamin D , Ergocalciferol , (DRISDOL ) 1.25 MG (50000 UNIT) CAPS capsule Take 1 capsule (50,000 Units total) by mouth every 7 (seven) days. 09/08/23   Joesph Annabella HERO, FNP    Allergies: Gentamicin and Penicillins    Review of Systems  Updated Vital Signs  Vitals:   03/26/24 1534 03/26/24 1535 03/26/24 1619 03/26/24 1620  BP: (!) 92/57  94/64   Pulse:  89 75 83  Resp: 15  15   Temp:   97.7 F (36.5 C)   TempSrc:   Oral   SpO2:  94% 97% 96%  Weight:      Height:         Physical Exam Vitals and nursing note reviewed.  HENT:     Head: Normocephalic.  Eyes:     Extraocular Movements: Extraocular movements intact.     Pupils: Pupils are equal, round, and reactive to light.  Cardiovascular:     Rate and Rhythm: Normal rate and regular rhythm.     Heart sounds: Normal  heart sounds.  Pulmonary:     Effort: Pulmonary effort is normal. No respiratory distress.     Breath sounds: Normal breath sounds.  Abdominal:     Palpations: Abdomen is soft.     Tenderness: There is no abdominal tenderness. There is no guarding.  Musculoskeletal:     Cervical back: Normal range of motion and neck supple. No rigidity.     Comments: Moves all extremities spontaneously without difficulty  Skin:    General: Skin is warm and dry.  Neurological:     General: No focal deficit present.     Mental Status: She is alert and oriented to person, place, and time.     (all labs ordered are listed, but only abnormal results are displayed) Labs Reviewed  BASIC METABOLIC PANEL WITH GFR - Abnormal; Notable for the following components:      Result Value   Calcium  8.2 (*)    All other components within normal limits  I-STAT CHEM 8, ED - Abnormal; Notable for the following components:   Calcium, Ion 1.11 (*)    All other components within normal limits  RESP PANEL BY RT-PCR (RSV, FLU A&B, COVID)  RVPGX2  CBC  HCG, SERUM, QUALITATIVE    EKG: None  Radiology: CT VENOGRAM HEAD Result Date: 03/26/2024 CLINICAL DATA:  Provided history: Headache, history of dural venous sinus thrombosis. Additional history provided: Coughing, congestion, head pressure, fatigue. EXAM: CT VENOGRAM HEAD TECHNIQUE: Venographic phase images of the brain were obtained following the administration of intravenous contrast. Multiplanar reformats and maximum intensity projections were generated. RADIATION DOSE REDUCTION: This exam was performed according to the departmental dose-optimization program which includes automated exposure control, adjustment of the mA and/or kV according to patient size and/or use of iterative reconstruction technique. CONTRAST:  75mL OMNIPAQUE  IOHEXOL  350 MG/ML SOLN COMPARISON:  CT venogram head 01/27/2024. FINDINGS: NON-CONTRAST HEAD CT: Brain: Cerebral volume is normal. There is no acute intracranial hemorrhage. No demarcated cortical infarct. No extra-axial fluid collection. No evidence of an intracranial mass. No midline shift. Vascular: No hyperdense vessel. Skull: No calvarial fracture or aggressive osseous lesion. Sinuses/Orbits: No orbital mass or acute orbital finding. Trace mucosal thickening within bilateral ethmoid air cells. CT VENOGRAM HEAD: Subocclusive thrombus within the transverse and sigmoid dural venous sinuses on the left, not significantly changed from the prior CT venogram head of 01/27/2024. The superior sagittal sinus, internal cerebral veins, vein of Galen, straight sinus, right transverse sinus, right sigmoid sinus and visualized jugular veins are patent. IMPRESSION: Non-contrast head CT: No evidence of an acute intracranial abnormality.  CT venogram head: 1. Subocclusive thrombus within the transverse and sigmoid dural venous sinuses on the left, unchanged from the prior CT venogram head of 01/27/2024. 2. No evidence of dural venous sinus thrombosis elsewhere. Electronically Signed   By: Rockey Childs D.O.   On: 03/26/2024 16:11     Procedures   Medications Ordered in the ED  acetaminophen  (TYLENOL ) tablet 1,000 mg (has no administration in time range)  pseudoephedrine  (SUDAFED) tablet 60 mg (has no administration in time range)                                    Medical Decision Making This patient presents to the ED for concern of URI symptoms, this involves an extensive number of treatment options, and is a complaint that carries with it a high risk of complications and morbidity.  The differential diagnosis includes COVID/flu/RSV, other viral URI, pneumonia, meningitis   Co morbidities that complicate the patient evaluation  History of dural venous sinus thrombosis   Additional history obtained:  Additional history obtained from record review External records from outside source obtained and reviewed including prior ED notes/imaging, most recent neurology note   Lab Tests:  I Ordered, and personally interpreted labs.  The pertinent results include: COVID/flu/RSV negative.  CBC within normal limits.  I-STAT Chem-8 notable for borderline low ionized calcium, BMP similarly notable for borderline hypocalcemia at 8.2.  Serum hCG negative.   Imaging Studies ordered:  I ordered imaging studies including CT venogram  I independently visualized and interpreted imaging which showed 1. Subocclusive thrombus within the transverse and sigmoid dural venous sinuses on the left, unchanged from the prior CT venogram head of 01/27/2024. 2. No evidence of dural venous sinus thrombosis elsewhere.  I agree with the radiologist interpretation   Cardiac Monitoring: / EKG:  The patient was maintained on a cardiac monitor.  I  personally viewed and interpreted the cardiac monitored which showed an underlying rhythm of: NSR   Problem List / ED Course / Critical interventions / Medication management  I ordered medication including Sudafed for nasal congestion, Tylenol  for headache Reevaluation of the patient after these medicines showed that the patient improved I have reviewed the patients home medicines and have made adjustments as needed   Social Determinants of Health:  Tobacco use   Test / Admission - Considered:  Physical exam is largely unremarkable as above.  History of dural venous sinus thrombosis, this was believed to be provoked by polysubstance abuse, was previously anticoagulated on Eliquis  but not presently as she was told to discontinue this medication in May of this year, she is followed by neurology. Imaging on 7/29 notable as follows: 1. Subocclusive thrombus within the sigmoid and transverse dural venous sinuses on the left, similar to the prior CT venogram head of 07/15/2023. 2. No evidence of dural venous sinus thrombosis elsewhere.  Given patient's history as above, will proceed with CT venogram to assess for worsening/change in her dural venous thrombus that may be contributing to her symptoms today.  At time of my reassessment, patient notes improvement in her headache, down from a 10/10 to a 7/10 after Tylenol /Sudafed.  CT imaging is reassuring, notable for stable dural venous thrombosis.  I suspect that her symptoms today are most consistent with a viral illness, given headache/cough/congestion.  COVID/flu/RSV negative.  I discussed these findings in depth with the patient, I recommend that she continue Tylenol /ibuprofen  as needed for headache as well as Mucinex/Sudafed as needed for cough/congestion.  She voiced understanding and is in agreement this plan.  Return precautions discussed.  She is appropriate for discharge at this time.       Amount and/or Complexity of Data  Reviewed Labs: ordered. Radiology: ordered.  Risk OTC drugs. Prescription drug management.        Final diagnoses:  Viral illness    ED Discharge Orders     None          [name] 03/26/24 1625    Francesca Elsie CROME, MD 03/27/24 [phone]

## 2024-03-26 NOTE — ED Notes (Signed)
 ED Provider at bedside.

## 2024-03-26 NOTE — ED Notes (Signed)
 Pt returned from CT

## 2024-04-04 ENCOUNTER — Other Ambulatory Visit: Payer: Self-pay

## 2024-04-04 ENCOUNTER — Emergency Department (HOSPITAL_COMMUNITY)
Admission: EM | Admit: 2024-04-04 | Discharge: 2024-04-05 | Disposition: A | Discharge status: Home / Self Care | Payer: MEDICAID | Attending: Emergency Medicine | Admitting: Emergency Medicine

## 2024-04-04 ENCOUNTER — Encounter (HOSPITAL_COMMUNITY): Payer: Self-pay | Admitting: *Deleted

## 2024-04-04 ENCOUNTER — Emergency Department (HOSPITAL_COMMUNITY): Payer: MEDICAID

## 2024-04-04 DIAGNOSIS — R059 Cough, unspecified: Secondary | ICD-10-CM | POA: Diagnosis present

## 2024-04-04 DIAGNOSIS — J40 Bronchitis, not specified as acute or chronic: Secondary | ICD-10-CM | POA: Insufficient documentation

## 2024-04-04 DIAGNOSIS — Z87891 Personal history of nicotine dependence: Secondary | ICD-10-CM | POA: Insufficient documentation

## 2024-04-04 NOTE — ED Triage Notes (Signed)
 The pt has been ill for a week  body aches cold cough  she was seen at [place] 3-4 days ago had the respiratory swab all was negative she reports no temp but she has intermittent chills and  productive cough thick green sputum

## 2024-04-05 MED ORDER — ALBUTEROL SULFATE HFA 108 (90 BASE) MCG/ACT IN AERS
2.0000 | INHALATION_SPRAY | Freq: Once | RESPIRATORY_TRACT | Status: AC
  Administered 2024-04-05: 2 via RESPIRATORY_TRACT
  Filled 2024-04-05: qty 6.7

## 2024-04-05 NOTE — ED Notes (Signed)
 RT called for breathing tx.

## 2024-04-05 NOTE — Discharge Instructions (Addendum)
Albuterol inhaler: 2 puffs every 4-6 hours if needed for wheezing or shortness of breath.

## 2024-04-05 NOTE — ED Notes (Signed)
 Assuming pt care, pt walk in for sob, cough and fatigue x 1 wk, pt aaox4, mae, skin warm/dry. Reports med hx. Clots to head recently stopped taking thinners. Family at bedside, call bell within reach.

## 2024-04-05 NOTE — ED Provider Notes (Signed)
 [place] EMERGENCY DEPARTMENT AT [facility] Provider Note  CSN: [id number] Arrival date & time: 04/04/24 2123  Chief Complaint(s) Generalized Body Aches  HPI [name] is a 33 y.o. female with a past medical history listed below who presents to the emergency department with worsening cough.  She was seen just over a week ago at [facility] for upper respiratory infection.  Reports that she has been taking over-the-counter medicine.  Did feel better for few days however developed more chest congestion.  She is a smoker.  No known fevers but endorses chills.  No nausea or vomiting.  No other physical complaints per  The history is provided by the patient.    Past Medical History Past Medical History:  Diagnosis Date   Alcohol abuse    Anxiety    Cerebral venous sinus thrombosis 01/06/2023   Chronic headache    Clotting disorder    Cocaine abuse (HCC)    Concussion    Marijuana abuse    Nicotine  dependence    Opioid use disorder    Polysubstance abuse (HCC)    Smoker    Patient Active Problem List   Diagnosis Date Noted   Clotting disorder    History of substance abuse (HCC) 10/09/2023   Dural venous sinus thrombosis 01/06/2023   Hypokalemia 01/06/2023   Substance use disorder 01/06/2023   Menorrhagia 01/06/2023   Home Medication(s) Prior to Admission medications   Medication Sig Start Date End Date Taking? Authorizing Provider  busPIRone  ([name] ) 10 MG tablet Take 1 tablet (10 mg total) by mouth 2 (two) times daily. 06/23/23   Whitfield Raisin, NP  clindamycin  (CLEOCIN ) 150 MG capsule Take 1 capsule (150 mg total) by mouth every 6 (six) hours. 01/27/24   Towana Ozell BROCKS, MD  cyanocobalamin  (VITAMIN B12) 1000 MCG tablet Take 1,000 mcg by mouth daily.    [provider]  ferrous sulfate  325 (65 FE) MG tablet Take 1 tablet (325 mg total) by mouth daily with breakfast. Patient not taking: Reported on 01/21/2024 01/08/23 10/06/23  Gregary Ozell, MD   ferrous sulfate  325 (65 FE) MG tablet Take 325 mg by mouth daily with breakfast.    [provider]  folic acid  (FOLVITE ) 1 MG tablet Take 1 tablet (1 mg total) by mouth daily. 06/23/23   Whitfield Raisin, NP  ibuprofen  (ADVIL ) 600 MG tablet Take 1 tablet (600 mg total) by mouth every 6 (six) hours as needed. Take 600 mg 2-3 times daily for reduction of bleeding. Use at the start of period for 3-5 days or until bleeding stops. 10/29/23   Davis, Devon E, PA-C  ondansetron  (ZOFRAN -ODT) 4 MG disintegrating tablet Take 1 tablet (4 mg total) by mouth every 8 (eight) hours as needed for nausea. 09/08/23   Joesph Annabella HERO, FNP  pyridOXINE  (VITAMIN B6) 50 MG tablet Take 50 mg by mouth daily.    [provider]  Rimegepant Sulfate (NURTEC) 75 MG TBDP Take 1 tablet (75 mg total) by mouth as needed. 10/06/23   Whitfield Raisin, NP  topiramate  (TOPAMAX ) 50 MG tablet Take 1 tablet (50 mg total) by mouth 2 (two) times daily. 10/06/23   Whitfield Raisin, NP  Vitamin D , Ergocalciferol , (DRISDOL ) 1.25 MG (50000 UNIT) CAPS capsule Take 1 capsule (50,000 Units total) by mouth every 7 (seven) days. 09/08/23   Joesph Annabella HERO, FNP  Allergies Gentamicin and Penicillins  Review of Systems Review of Systems As noted in HPI  Physical Exam Vital Signs  I have reviewed the triage vital signs BP (!) 128/92   Pulse 89   Temp 98.6 F (37 C)   Resp 16   Ht 5' 3 (1.6 m)   Wt 65.8 kg   SpO2 100%   BMI 25.70 kg/m   Physical Exam Vitals reviewed.  Constitutional:      General: She is not in acute distress.    Appearance: She is well-developed. She is not diaphoretic.  HENT:     Head: Normocephalic and atraumatic.     Nose: Nose normal.  Eyes:     General: No scleral icterus.       Right eye: No discharge.        Left eye: No discharge.     Conjunctiva/sclera:  Conjunctivae normal.     Pupils: Pupils are equal, round, and reactive to light.  Cardiovascular:     Rate and Rhythm: Normal rate and regular rhythm.     Heart sounds: No murmur heard.    No friction rub. No gallop.  Pulmonary:     Effort: Pulmonary effort is normal. No respiratory distress.     Breath sounds: Normal breath sounds. No stridor. No wheezing, rhonchi or rales.  Abdominal:     General: There is no distension.     Palpations: Abdomen is soft.     Tenderness: There is no abdominal tenderness.  Musculoskeletal:        General: No tenderness.     Cervical back: Normal range of motion and neck supple.  Skin:    General: Skin is warm and dry.     Findings: No erythema or rash.  Neurological:     Mental Status: She is alert and oriented to person, place, and time.     ED Results and Treatments Labs (all labs ordered are listed, but only abnormal results are displayed) Labs Reviewed - No data to display                                                                                                                       EKG  EKG Interpretation Date/Time:    Ventricular Rate:    PR Interval:    QRS Duration:    QT Interval:    QTC Calculation:   R Axis:      Text Interpretation:         Radiology DG Chest 2 View Result Date: 04/04/2024 CLINICAL DATA:  Productive cough for 1 week.  Chills. EXAM: CHEST - 2 VIEW COMPARISON:  07/26/2022 FINDINGS: The cardiomediastinal contours are normal. Chronic eventration of right hemidiaphragm. Pulmonary vasculature is normal. No consolidation, pleural effusion, or pneumothorax. Slight chronic interstitial coarsening. No acute osseous abnormalities are seen. IMPRESSION: No acute chest findings. Electronically Signed   By: Andrea Gasman M.D.   On: 04/04/2024 22:25    Medications Ordered in ED Medications  albuterol (  VENTOLIN HFA) 108 (90 Base) MCG/ACT inhaler 2 puff (2 puffs Inhalation Given 04/05/24 0241)    Procedures Procedures  (including critical care time) Medical Decision Making / ED Course   Medical Decision Making Risk Prescription drug management.    Cough differential diagnosis considered Given recent viral upper respiratory, new chest congestion likely bronchitis.  Chest x-ray obtained to rule out pneumonia and negative. Patient is afebrile here. No indication for antibiotics at this time.  Albuterol puffs as needed for shortness of breath.     Final Clinical Impression(s) / ED Diagnoses Final diagnoses:  Bronchitis   The patient appears reasonably screened and/or stabilized for discharge and I doubt any other medical condition or other [facility] requiring further screening, evaluation, or treatment in the ED at this time. I have discussed the findings, Dx and Tx plan with the patient/family who expressed understanding and agree(s) with the plan. Discharge instructions discussed at length. The patient/family was given strict return precautions who verbalized understanding of the instructions. No further questions at time of discharge.  Disposition: Discharge  Condition: Good  ED Discharge Orders     None         Follow Up: Joesph Annabella HERO, FNP [address] [place] [address] [phone]  Call  to schedule an appointment for close follow up     This chart was dictated using voice recognition software.  Despite best efforts to proofread,  errors can occur which can change the documentation meaning.    Trine Raynell Moder, MD 04/05/24 [phone]

## 2024-04-06 ENCOUNTER — Encounter: Payer: Self-pay | Admitting: Family Medicine

## 2024-04-06 ENCOUNTER — Ambulatory Visit (INDEPENDENT_AMBULATORY_CARE_PROVIDER_SITE_OTHER): Payer: MEDICAID | Admitting: Family Medicine

## 2024-04-06 VITALS — BP 104/82 | HR 86 | Temp 98.3°F | Ht 63.0 in | Wt 142.8 lb

## 2024-04-06 DIAGNOSIS — J329 Chronic sinusitis, unspecified: Secondary | ICD-10-CM

## 2024-04-06 DIAGNOSIS — J40 Bronchitis, not specified as acute or chronic: Secondary | ICD-10-CM | POA: Diagnosis not present

## 2024-04-06 MED ORDER — LEVOFLOXACIN 500 MG PO TABS: 500.0000 mg | ORAL_TABLET | Freq: Every day | ORAL | 0 refills | Status: DC

## 2024-04-06 MED ORDER — ALBUTEROL SULFATE (2.5 MG/3ML) 0.083% IN NEBU: 2.5000 mg | INHALATION_SOLUTION | Freq: Four times a day (QID) | RESPIRATORY_TRACT | 1 refills | Status: AC | PRN

## 2024-04-06 NOTE — Progress Notes (Signed)
 Chief Complaint  Patient presents with   Follow-up    Bronchitis SOB Coughing and congestion    HPI  Patient presents today for Discussed the use of AI scribe software for clinical note transcription with the patient, who gave verbal consent to proceed.  History of Present Illness [name] is a 32 year old female who presents with shortness of breath, cough, and congestion.  Her symptoms began about a week ago with difficulty breathing, a severe cough, and congestion. Initially, she sought care at an urgent care where she was diagnosed with a viral infection. However, her symptoms persisted, leading her to visit the emergency room recently.  During the emergency room visit, a chest x-ray was performed, and she was diagnosed with bronchitis. She was prescribed an inhaler but not antibiotics and was advised to follow up with her primary care provider for further evaluation.  She describes her cough as productive, with thick green phlegm, and reports a stuffy, runny nose with similar green discharge. Congestion is primarily in her nose, and she initially had headaches, which have since resolved. Her chest feels 'gurgly', and she continues to experience shortness of breath.  She mentions having a nebulizer at home but lacks the medication for it. She has been unable to smoke since her symptoms began, which she notes has been a positive step towards quitting smoking.  No fever or other systemic symptoms.     PMH: Smoking status noted Review of Systems  Objective: BP 104/82   Pulse 86   Temp 98.3 F (36.8 C)   Ht 5' 3 (1.6 m)   Wt 142 lb 12.8 oz (64.8 kg)   SpO2 97%   BMI 25.30 kg/m  Gen: NAD, alert, cooperative with exam HEENT: NCAT, EOMI, PERRL CV: RRR, good S1/S2, no murmur Resp: bronchitic changes throughout Ext: No edema, warm Neuro: Alert and oriented, No gross deficits  Physical Exam GENERAL: Alert, cooperative, well developed, no acute distress. HEENT:  Normocephalic, normal oropharynx, moist mucous membranes. CARDIOVASCULAR: Normal heart rate and rhythm, S1 and S2 normal without murmurs. EXTREMITIES: No cyanosis or edema.     Sinobronchitis  Other orders -     Albuterol Sulfate; Take 3 mLs (2.5 mg total) by nebulization every 6 (six) hours as needed for wheezing or shortness of breath.  Dispense: 150 mL; Refill: 1 -     levoFLOXacin ; Take 1 tablet (500 mg total) by mouth daily. For 10 days  Dispense: 10 tablet; Refill: 0    Assessment and Plan Assessment & Plan Acute bronchitis   Acute bronchitis was diagnosed a week ago with symptoms of dyspnea, severe cough, congestion, and thick green sputum. Symptoms have progressed to include nasal congestion and a gurgly sensation in the chest. She is a smoker attempting to quit, which may exacerbate symptoms. Prescribe azithromycin  (Z-Pak) and nebulizer treatment with albuterol. Send prescriptions to [facility] pharmacy.      Butler Der, MD

## 2024-04-22 ENCOUNTER — Inpatient Hospital Stay: Admitting: Family Medicine

## 2024-04-27 NOTE — Progress Notes (Deleted)
 [facility] [address] [place]. [place] [address] [phone]       STROKE FOLLOW UP NOTE  [name] [name] Date of Birth:  10-06-1990 Medical Record Number:  [id number]    Primary neurologist: Dr. Rosemarie Reason for visit: CVST    SUBJECTIVE:   CHIEF COMPLAINT:  No chief complaint on file.      HPI:   Update 04/28/2024 JM: Patient returns for 72-month follow-up visit.   [name]  discontinued in 09/2023 and was transitioned to aspirin 81 mg daily which she has been tolerating without difficulty.  Remains on topiramate  50 mg twice daily for headache [name], [name] of Nurtec ***  No new stroke/TIA symptoms.        History provided for reference purposes only Update 10/06/2023 JM: Patient returns for follow-up visit.  Previously complained of persistent headaches and recommended initiating topiramate  as discussed at prior visits.  She contacted office in January with some improvement of headaches but still present about every other day therefore increase topiramate  to 50 mg twice daily.  CTV 07/2023 showed decreased left transverse sinus and left sigmoid sinus clot which is now nonocclusive and prior occlusive thrombus in left IJ no longer seen.  It was recommended she continue on [name]  for at least 9 to 82-month duration.   Reports improvement of headaches since prior visit, currently experienced about 2 to 3/month with ongoing [name] of topiramate . Will [name] Tylenol  with headache with some benefit.  No new stroke/TIA symptoms.  Remains on [name]  without side effects.  Also remains on supplements including vitamin D , B12, B6, iron and folic acid .  She denies any additional drug [name] since 12/2022 and very minimal amount of EtOH.  She questions restarting naltextron which was started in hospital back in 12/2022 which helped reduce cravings.  Routinely follows with PCP.  Update 06/03/2023 JM: Patient returns for sooner follow-up visit with concern of nose  bleeds over the past 3 months and worsening headaches.  She is currently incarcerated at [facility] detention center since 10/1 and accompanied by officer.  Patients mother called office on 11/6 reporting nosebleeds and throwing up blood, nurse spoke with jail nurse who reports these concerns have been evaluated multiple times and refusing to show evidence of bleeding, she apparently was not taking her [name]  correctly prior to incarceration, although per patient she was taking correctly and facility giving her [name]  every 8-10 hrs, facility conformed they were administering [name]  every 12 hours, they planned to continue to monitor and would pursue further evaluation if indicated.   Currently, she reports continued nosebleeds, counts about 6 total since October, once she spit up blood. Nosebleeds do not last long. She reports currently receiving [name]  correctly every 12 hours. She reports worsening headache since mid October as she reports facility stopped giving her her medications for 2 weeks as they accused her of lying about taking her medications (again, per patient report). Upon further discussion, she never actually started topiramate  prior to incarceration as she was not able to afford therefore it was never started at facility as she was not actively taking prior. She feels her headaches have worsened over the past month, similar to how she felt when she was first found to have CVST. Can have blurred vision with more severe headache, denies vision loss or double vision.  Denies any weakness or speech changes. Does report intermittent bilateral hand numbness and cramping/stiffness in the middle of the night, feels like her heart is racing, she was told  due to anxiety as her [name]  was stopped upon incarceration for unclear reason. CTV ordered on 10/1 but has not been able to be scheduled as she has been incarcerated.  She does report ongoing [name] of iron, folic acid  and B vitamins.  Initial  visit 03/10/2023 JM: Being seen for hospital follow up unaccompanied. She has been doing well overall since discharge but continues to experience headaches but gradually improving since discharge, located top of head and occipital, about 2-3 per week, will [name] tylenol , occasionally beneficial and will resolve after 1 hr but when Tylenol  not beneficial, will have to sit in dark room, will have associated photophobia, denies phonophobia or N/V. Was having frequent headaches that started back in 07/2021, no prior treatment.  Denies new stroke/TIA symptoms.  Continues to [name] , does have hx of menorrhagia, slightly worsened on [name] , does not follow with OBGYN. Has seen PCP since discharge and f/u scheduled 9/19. Reports some labs completed but unsure which ones were completed.  She continues to take B12, folic acid  and iron supplements.  Denies any additional drug [name] since discharge. Has had a couple beers since discharge but denies excessive [name].  Continue tobacco [name] although gradually reducing amount, currently 0.5 PPD, prior over 1 PPD, goal is to eventually quit.  Stroke admission 01/06/2023 [name] [name] is a 33 y.o. female with history of recent concussion, polysubstance abuse, alcohol abuse, tobacco abuse who presented to ED on 01/06/2023 with persistent headache. CTH and CTV showed occlusive dural venous sinus thrombosis extending from the torcula to the left IJ and including the entire left transverse and sigmoid sinuses.  MRI no acute process.  Placed on IV heparin  and transition to [name] , recommended hematology consult for [facility] duration.  UDS positive for opioids, cocaine, benzos and THC.  Hypercoagulable panel largely normal except homocystine 54.8 likely related to smoking, started B12, folate and B6.  Polysubstance abuse cessation discussed.   PERTINENT IMAGING  CT head High-density appearance at the left transverse and sigmoid dural sinuses CTV-Occlusive dural venous sinus thrombosis  extending from the torcula to the left IJ and including the entire left transverse and sigmoid sinuses. No brain edema or hemorrhage. 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50    Average packs/day: 0.5 packs/day for 18.8  years (9.4 ttl pk-yrs)    Types: Cigarettes    Start date: 2007   Smokeless tobacco: Never  Vaping [name]   Vaping status: Never Used  Substance and Sexual Activity   Alcohol [name]: Not Currently    Comment: occ   Drug [name]: Yes    Types: Cocaine, Benzodiazepines, Marijuana    Comment: last used 06/02/18   Sexual activity: Yes    Birth control/protection: None  Other Topics Concern   Not on file  Social History Narrative   Not on file   Social Drivers of Health   Financial Resource Strain: High Risk (04/05/2024)   Overall Financial Resource Strain (CARDIA)    Difficulty of Paying Living Expenses: Hard  Food Insecurity: Food Insecurity Present (04/05/2024)   Hunger  Vital Sign    Worried About Running Out of Food in the Last Year: Often true    Ran Out of Food in the Last Year: Often true  Transportation Needs: No Transportation Needs (04/05/2024)   PRAPARE - Administrator, Civil Service (Medical): No    Lack of Transportation (Non-Medical): No  Physical Activity: Inactive (04/05/2024)   Exercise Vital Sign    Days of Exercise per Week: 0 days    Minutes of Exercise per Session: Not on file  Stress: No Stress Concern Present (04/05/2024)   [employer] of Occupational Health - Occupational Stress Questionnaire    Feeling of Stress: Only a little  Social Connections: Socially Isolated (04/05/2024)   Social Connection and Isolation Panel    Frequency of Communication with Friends and Family: More than three times a week    Frequency of Social Gatherings with Friends and Family: Once a week    Attends Religious Services: Never    Database Administrator or Organizations: No    Attends Engineer, Structural: Not on file    Marital Status: Never married  Intimate Partner Violence: Not At Risk (01/06/2023)   Humiliation, Afraid, Rape, and Kick questionnaire    Fear of Current or Ex-Partner: No    Emotionally Abused: No    Physically Abused: No    Sexually Abused: No    Family History:  Family History  Problem Relation Age of Onset   Cancer Mother        Skin cancer on nose   Anxiety disorder Mother    Diabetes Mother    Hypertension Mother    Alcohol abuse Father    ADD / ADHD Brother    Heart attack Paternal Grandmother     Medications:   Current Outpatient Medications on File Prior to Visit  Medication Sig Dispense Refill   albuterol (PROVENTIL) (2.5 MG/3ML) 0.083% nebulizer solution Take 3 mLs (2.5 mg total) by nebulization every 6 (six) hours as needed for wheezing or shortness of breath. 150 mL 1   busPIRone  ([name] ) 10 MG tablet Take 1 tablet (10 mg total) by mouth 2 (two) times daily. 60 tablet 5    cyanocobalamin  (VITAMIN B12) 1000 MCG tablet Take 1,000 mcg by mouth daily.     ferrous sulfate  325 (65 FE) MG tablet Take 1 tablet (325 mg total) by mouth daily with breakfast. (Patient not taking: Reported on 01/21/2024) 100 tablet 0   ferrous sulfate  325 (65 FE) MG tablet Take 325 mg by mouth daily with breakfast.     folic acid  (FOLVITE ) 1 MG tablet Take 1 tablet (1 mg total) by mouth daily. 30 tablet 5   ibuprofen  (ADVIL ) 600 MG  tablet Take 1 tablet (600 mg total) by mouth every 6 (six) hours as needed. Take 600 mg 2-3 times daily for reduction of bleeding. [name] at the start of period for 3-5 days or until bleeding stops. 30 tablet 1   levofloxacin  (LEVAQUIN ) 500 MG tablet Take 1 tablet (500 mg total) by mouth daily. For 10 days 10 tablet 0   ondansetron  (ZOFRAN -ODT) 4 MG disintegrating tablet Take 1 tablet (4 mg total) by mouth every 8 (eight) hours as needed for nausea. 10 tablet 0   pyridOXINE  (VITAMIN B6) 50 MG tablet Take 50 mg by mouth daily.     Rimegepant Sulfate (NURTEC) 75 MG TBDP Take 1 tablet (75 mg total) by mouth as needed. 16 tablet 11   topiramate  (TOPAMAX ) 50 MG tablet Take 1 tablet (50 mg total) by mouth 2 (two) times daily. 60 tablet 5   Vitamin D , Ergocalciferol , (DRISDOL ) 1.25 MG (50000 UNIT) CAPS capsule Take 1 capsule (50,000 Units total) by mouth every 7 (seven) days. 12 capsule 0   No current facility-administered medications on file prior to visit.    Allergies:   Allergies  Allergen Reactions   Gentamicin Rash   Penicillins Rash      OBJECTIVE:  Physical Exam  There were no vitals filed for this visit.  There is no height or weight on file to calculate BMI. No results found.  General: well developed, well nourished, very pleasant young Caucasian female, seated, in no evident distress  Neurologic Exam Mental Status: Awake and fully alert. Fluent speech and language. Oriented to place and time. Recent and remote memory intact. Attention span,  concentration and fund of knowledge appropriate. Mood and affect appropriate.  Cranial Nerves: Pupils equal, briskly reactive to light. Extraocular movements full without nystagmus. Visual fields full to confrontation. Hearing intact. Facial sensation intact. Face, tongue, palate moves normally and symmetrically.  Motor: Normal bulk and tone. Normal strength in all tested extremity muscles  Sensory.: intact to touch , pinprick , position and vibratory sensation.  Coordination: Rapid alternating movements normal in all extremities. Finger-to-nose and heel-to-shin unable to test Gait and Station: Arises from chair without difficulty. Stance is normal. Gait demonstrates normal stride length and balance without [name] of AD.  Reflexes: 1+ and symmetric. Toes downgoing.         ASSESSMENT: [name] [name] is a 33 y.o. 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 Vascular risk factors include polysubstance abuse (cocaine, opiates, benzos, THC, EtOH, tobacco), and hyperhomocysteinemia.  Headaches greatly improved on topiramate .     PLAN:  CVST :  Vascular headache: Repeat CTV 07/2023 significantly decreased clot in left transverse sinus and left sigmoid sinus and resolution of left IJ occlusive thrombus Continue topiramate  50 mg twice daily for headache prophylaxis Start Nurtec as needed at onset of headache, triptan's contraindicated d/t CVST hx Continue aspirin 81mg  daily for stroke [name] measures Completed course of [name]  (from 12/2022-09/2023)   Hyperhomocysteinemia: Iron deficiency: Vitamin D  deficiency: Homocystine level 5.5 (06/2023) Homocystine level 24.9 (03/2023) Homocystine level 54.8 (12/2022) B12 667, folate >20 (06/2023) Iron 90 (06/2023) --> 19 (12/2022) Vitamin D  18.6 (06/2023) Continue oral iron, B6, B12, folic acid  and vitamin D  Plan repeat  labs at follow-up visit   Polysubstance abuse: Tobacco abuse EtOH abuse Denies any recurrent drug [name] since 12/2022, only occasional EtOH [name] UDS 12/2022 positive for opioids, cocaine, benzos and THC Discussed importance of  complete tobacco cessation and to follow-up with PCP if assistance is needed She questions restarting naltrexone  to further assist with alcohol cessation, she was advised to follow-up with her PCP for further discussion     Follow up in 6 months or call earlier if needed    CC:  PCP: Joesph Annabella HERO, FNP      Harlene Bogaert, AGNP-BC  [facility] [address] [place], [place] [address]  Phone [phone] Fax [phone] Note: This document was prepared with digital dictation and possible smart phrase technology. Any transcriptional errors that result from this process are unintentional.

## 2024-04-28 ENCOUNTER — Ambulatory Visit: Admitting: Adult Health

## 2024-04-28 ENCOUNTER — Encounter: Payer: Self-pay | Admitting: Adult Health

## 2024-07-02 ENCOUNTER — Telehealth: Payer: Self-pay

## 2024-07-02 NOTE — Telephone Encounter (Signed)
 Copied from CRM [id number]. Topic: Clinical - Request for Lab/Test Order >> Jul 02, 2024 10:49 AM [name] wrote: Reason for CRM: Patient was informed she needs to be tested for trichomonas. She reports no current symptoms. Patient would like to come in today. The patient can be reached at [phone]

## 2024-07-02 NOTE — Progress Notes (Signed)
 I called and spoke to patient.  Patient already on Flagyl for known exposure to trichomonas.  This will also cover bacterial vaginosis.  Patient was prescribed Diflucan for yeast vaginitis.  Patient also being treated for chlamydia with doxycycline , she also has a urinary tract infection and a culture is pending. Patient tolerated Rocephin  injection without any side effects.

## 2024-07-02 NOTE — Telephone Encounter (Signed)
NTBS.

## 2024-07-05 ENCOUNTER — Ambulatory Visit: Payer: MEDICAID | Admitting: Family Medicine

## 2024-07-05 NOTE — Progress Notes (Signed)
 Please call patient let her know that her infection is E. coli and the doxycycline  that she is on should cover that bacteria.  Continued problems she needs to follow-up with her PCP.  Thank you

## 2024-07-05 NOTE — Telephone Encounter (Signed)
 LMTCB to schedule appt

## 2024-07-06 ENCOUNTER — Other Ambulatory Visit: Payer: Self-pay

## 2024-07-06 ENCOUNTER — Encounter (HOSPITAL_COMMUNITY): Payer: Self-pay | Admitting: Emergency Medicine

## 2024-07-06 ENCOUNTER — Emergency Department (HOSPITAL_COMMUNITY)
Admission: EM | Admit: 2024-07-06 | Discharge: 2024-07-06 | Disposition: A | Discharge status: Home / Self Care | Payer: MEDICAID | Attending: Emergency Medicine | Admitting: Emergency Medicine

## 2024-07-06 DIAGNOSIS — W540XXA Bitten by dog, initial encounter: Secondary | ICD-10-CM | POA: Insufficient documentation

## 2024-07-06 DIAGNOSIS — S41151A Open bite of right upper arm, initial encounter: Secondary | ICD-10-CM | POA: Insufficient documentation

## 2024-07-06 DIAGNOSIS — Z2914 Encounter for prophylactic rabies immune globin: Secondary | ICD-10-CM | POA: Diagnosis not present

## 2024-07-06 MED ORDER — RABIES VIRUS VACCINE, HDC IM SUSR
1.0000 mL | Freq: Once | INTRAMUSCULAR | Status: DC
  Filled 2024-07-06: qty 1

## 2024-07-06 MED ORDER — IBUPROFEN 400 MG PO TABS
400.0000 mg | ORAL_TABLET | Freq: Once | ORAL | Status: AC
  Administered 2024-07-06: 400 mg via ORAL
  Filled 2024-07-06: qty 1

## 2024-07-06 MED ORDER — CLINDAMYCIN HCL 300 MG PO CAPS: 300.0000 mg | ORAL_CAPSULE | Freq: Three times a day (TID) | ORAL | 0 refills | Status: AC

## 2024-07-06 MED ORDER — ACETAMINOPHEN 325 MG PO TABS
650.0000 mg | ORAL_TABLET | Freq: Once | ORAL | Status: AC
  Administered 2024-07-06: 650 mg via ORAL
  Filled 2024-07-06: qty 2

## 2024-07-06 MED ORDER — CLINDAMYCIN HCL 150 MG PO CAPS
300.0000 mg | ORAL_CAPSULE | Freq: Once | ORAL | Status: AC
  Administered 2024-07-06: 300 mg via ORAL
  Filled 2024-07-06: qty 2

## 2024-07-06 MED ORDER — CIPROFLOXACIN HCL 250 MG PO TABS
500.0000 mg | ORAL_TABLET | Freq: Once | ORAL | Status: AC
  Administered 2024-07-06: 500 mg via ORAL
  Filled 2024-07-06: qty 2

## 2024-07-06 MED ORDER — RABIES IMMUNE GLOBULIN 300 UNIT/2ML IJ SOLN
20.0000 [IU]/kg | Freq: Once | INTRAMUSCULAR | Status: AC
  Administered 2024-07-06: 1200 [IU] via INTRAMUSCULAR
  Filled 2024-07-06: qty 8

## 2024-07-06 MED ORDER — CIPROFLOXACIN HCL 500 MG PO TABS: 500.0000 mg | ORAL_TABLET | Freq: Two times a day (BID) | ORAL | 0 refills | Status: AC

## 2024-07-06 NOTE — ED Notes (Signed)
 Pts puncture wounds flushed and cleansed on R arm

## 2024-07-06 NOTE — ED Provider Notes (Addendum)
 " [place] EMERGENCY DEPARTMENT AT [facility] Provider Note   CSN: [id number] Arrival date & time: 07/06/24  9673     Patient presents with: Animal Bite   [name] is a 34 y.o. female.   The history is provided by the patient.  Animal Bite  She has history of cerebral venous sinus thrombosis, polysubstance abuse and comes in because of dog bites to her right arm.  She states that she got out of her car and a dog came up and bit her several times on her right arm.  She does not know the dog who it is owned by.  The dog then ran away.  She is up-to-date on tetanus immunization.    Prior to Admission medications  Medication Sig Start Date End Date Taking? Authorizing Provider  albuterol  (PROVENTIL ) (2.5 MG/3ML) 0.083% nebulizer solution Take 3 mLs (2.5 mg total) by nebulization every 6 (six) hours as needed for wheezing or shortness of breath. 04/06/24   Zollie Lowers, MD  busPIRone  ([name] ) 10 MG tablet Take 1 tablet (10 mg total) by mouth 2 (two) times daily. 06/23/23   Whitfield Raisin, NP  cyanocobalamin  (VITAMIN B12) 1000 MCG tablet Take 1,000 mcg by mouth daily.    [provider]  ferrous sulfate  325 (65 FE) MG tablet Take 1 tablet (325 mg total) by mouth daily with breakfast. Patient not taking: Reported on 01/21/2024 01/08/23 10/06/23  Gregary Sharper, MD  ferrous sulfate  325 (65 FE) MG tablet Take 325 mg by mouth daily with breakfast.    [provider]  folic acid  (FOLVITE ) 1 MG tablet Take 1 tablet (1 mg total) by mouth daily. 06/23/23   Whitfield Raisin, NP  ibuprofen  (ADVIL ) 600 MG tablet Take 1 tablet (600 mg total) by mouth every 6 (six) hours as needed. Take 600 mg 2-3 times daily for reduction of bleeding. Use at the start of period for 3-5 days or until bleeding stops. 10/29/23   Davis, Devon E, PA-C  levofloxacin  (LEVAQUIN ) 500 MG tablet Take 1 tablet (500 mg total) by mouth daily. For 10 days 04/06/24   Zollie Lowers, MD  ondansetron   (ZOFRAN -ODT) 4 MG disintegrating tablet Take 1 tablet (4 mg total) by mouth every 8 (eight) hours as needed for nausea. 09/08/23   Joesph Annabella HERO, FNP  pyridOXINE  (VITAMIN B6) 50 MG tablet Take 50 mg by mouth daily.    [provider]  Rimegepant Sulfate (NURTEC) 75 MG TBDP Take 1 tablet (75 mg total) by mouth as needed. 10/06/23   Whitfield Raisin, NP  topiramate  (TOPAMAX ) 50 MG tablet Take 1 tablet (50 mg total) by mouth 2 (two) times daily. 10/06/23   Whitfield Raisin, NP  Vitamin D , Ergocalciferol , (DRISDOL ) 1.25 MG (50000 UNIT) CAPS capsule Take 1 capsule (50,000 Units total) by mouth every 7 (seven) days. 09/08/23   Joesph Annabella HERO, FNP    Allergies: Gentamicin and Penicillins    Review of Systems  All other systems reviewed and are negative.   Updated Vital Signs BP 111/76   Pulse (!) 104   Temp 97.6 F (36.4 C) (Oral)   Ht 5' 3 (1.6 m)   Wt 65 kg   SpO2 100%   BMI 25.38 kg/m   Physical Exam Vitals and nursing note reviewed.   34 year old female, resting comfortably and in no acute distress. Vital signs are significant for slightly elevated heart rate. Oxygen saturation is 100%, which is normal. Head is normocephalic and atraumatic. PERRLA,  EOMI. Lungs are clear without rales, wheezes, or rhonchi. Heart has regular rate and rhythm without murmur. Abdomen is soft, flat. Extremities: Multiple puncture wounds are present on the right upper and lower arm.  Distal neurovascular exam is intact with strong pulses, prompt capillary refill, normal sensation. Skin is warm and dry without rash. Neurologic: Awake and alert, moves all extremities equally.         Procedures   Medications Ordered in the ED  rabies immune globulin  (HYPERRAB) injection 1,200 Units (has no administration in time range)  rabies vaccine, human diploid (IMOVAX) injection 1 mL (has no administration in time range)  ciprofloxacin  (CIPRO ) tablet 500 mg (has no administration in time range)   clindamycin  (CLEOCIN ) capsule 300 mg (has no administration in time range)  ibuprofen  (ADVIL ) tablet 400 mg (has no administration in time range)  acetaminophen  (TYLENOL ) tablet 650 mg (has no administration in time range)                                    Medical Decision Making Risk OTC drugs. Prescription drug management.   Dog bite on right arm from an unknown animal that is not able to be traced.  Therefore, patient will require rabies prophylaxis.  I have reviewed her past records and do note tetanus immunization done on 09/19/2020.  I note allergy to penicillin, I have ordered ciprofloxacin  and clindamycin  for antibiotic prophylaxis.  I have ordered rabies immunoglobulin and rabies vaccine administration.  I am discharging her with a prescription for a 5-day course of ciprofloxacin  and clindamycin .  She will need to return for the remainder of the rabies vaccination series.     Final diagnoses:  Dog bite of right upper extremity, initial encounter    ED Discharge Orders          Ordered    ciprofloxacin  (CIPRO ) 500 MG tablet  Every 12 hours        07/06/24 0411    clindamycin  (CLEOCIN ) 300 MG capsule  3 times daily        07/06/24 0411               Raford Lenis, MD 07/06/24 [phone]  After getting the rabies immunoglobulin injected around her wounds, patient refused getting the remainder of the rabies immunoglobulin and also refused the rabies vaccine.  I did explain to her that without the shots, if she develops rabies she will die.  Patient expressed understanding.  I have informed her that she is welcome to return to anytime to get the remainder of the rabies immunoglobulin and the rabies vaccine series.  In the meantime I have advised her to apply ice to sore areas and take acetaminophen  and ibuprofen  as needed for pain.  She still needs to take the antibiotic prophylaxis noted above.    Raford Lenis, MD 07/06/24 [phone]  "

## 2024-07-06 NOTE — Discharge Instructions (Addendum)
 You refused half of the rabies immunoglobulin and refused the rabies vaccine.  Please be aware that without these shots, you are not protected against rabies.  If you do develop rabies, almost everybody who gets rabies dies.  If you change your mind about getting the rabies shots, you are welcome to return at any time.  You may apply ice to areas that are painful.  It will help with pain and help with swelling.  You may take acetaminophen /or ibuprofen  as needed for pain.  Please be aware that if you combine acetaminophen  and ibuprofen , he will get better pain relief and you get from taking other medication by itself.

## 2024-07-06 NOTE — ED Notes (Signed)
 [facility]com called @ 0345 to report animal bite.

## 2024-07-06 NOTE — ED Triage Notes (Signed)
 Pt states she was bit by a unknown dog in her yard earlier tonight. Pt has multiple puncture wounds to right arm. Pt does not know the animal or if it has had any shots.

## 2024-07-06 NOTE — ED Notes (Signed)
 Pt refused the rest of her rabies vaccine after infiltration of puncture wounds in R arm. RN and MD informed pt the risk of refusing the rest of the rabies vaccinations including the risk of death--pt verbalizes understanding of risks and states I understand the risks, but I am not getting anymore of it MD informed pt that she could come back for the rest of her vaccinations if she changed her mind.
